# Patient Record
Sex: Male | Born: 1955 | Race: White | Hispanic: No | Marital: Single | Smoking: Former smoker
Health system: Southern US, Community
[De-identification: ages and names within clinical notes are randomized; demographics above are authoritative.]

## PROBLEM LIST (undated history)

## (undated) DIAGNOSIS — K219 Gastro-esophageal reflux disease without esophagitis: Secondary | ICD-10-CM

## (undated) DIAGNOSIS — N529 Male erectile dysfunction, unspecified: Secondary | ICD-10-CM

## (undated) DIAGNOSIS — F1910 Other psychoactive substance abuse, uncomplicated: Secondary | ICD-10-CM

## (undated) DIAGNOSIS — I10 Essential (primary) hypertension: Secondary | ICD-10-CM

## (undated) DIAGNOSIS — C4491 Basal cell carcinoma of skin, unspecified: Secondary | ICD-10-CM

## (undated) DIAGNOSIS — E291 Testicular hypofunction: Secondary | ICD-10-CM

## (undated) HISTORY — PX: SHOULDER INJECTION: SHX5048

## (undated) HISTORY — PX: LACERATION REPAIR: SHX5168

## (undated) HISTORY — DX: Essential (primary) hypertension: I10

## (undated) HISTORY — DX: Basal cell carcinoma of skin, unspecified: C44.91

## (undated) HISTORY — PX: NECK SURGERY: SHX720

## (undated) HISTORY — DX: Testicular hypofunction: E29.1

## (undated) HISTORY — DX: Gastro-esophageal reflux disease without esophagitis: K21.9

## (undated) HISTORY — DX: Other psychoactive substance abuse, uncomplicated: F19.10

## (undated) HISTORY — DX: Male erectile dysfunction, unspecified: N52.9

---

## 2002-12-25 ENCOUNTER — Encounter: Payer: Self-pay | Admitting: Family Medicine

## 2003-12-25 ENCOUNTER — Encounter: Payer: Self-pay | Admitting: Family Medicine

## 2003-12-31 ENCOUNTER — Ambulatory Visit: Payer: Self-pay | Admitting: Family Medicine

## 2004-01-05 ENCOUNTER — Ambulatory Visit: Payer: Self-pay | Admitting: Family Medicine

## 2004-02-25 HISTORY — PX: SHOULDER SURGERY: SHX246

## 2004-02-25 HISTORY — PX: SHOULDER ARTHROSCOPY: SHX128

## 2004-05-28 ENCOUNTER — Ambulatory Visit: Payer: Self-pay | Admitting: Family Medicine

## 2004-09-07 ENCOUNTER — Ambulatory Visit: Payer: Self-pay | Admitting: Specialist

## 2004-10-04 ENCOUNTER — Ambulatory Visit: Payer: Self-pay | Admitting: Family Medicine

## 2005-03-03 ENCOUNTER — Ambulatory Visit: Payer: Self-pay | Admitting: Specialist

## 2005-04-22 ENCOUNTER — Ambulatory Visit: Payer: Self-pay | Admitting: Family Medicine

## 2005-05-04 ENCOUNTER — Ambulatory Visit: Payer: Self-pay | Admitting: Family Medicine

## 2005-08-10 ENCOUNTER — Ambulatory Visit: Payer: Self-pay | Admitting: Family Medicine

## 2005-11-07 ENCOUNTER — Ambulatory Visit: Payer: Self-pay | Admitting: Family Medicine

## 2005-11-07 ENCOUNTER — Encounter: Admission: RE | Admit: 2005-11-07 | Discharge: 2005-11-07 | Payer: Self-pay | Admitting: Family Medicine

## 2006-01-13 ENCOUNTER — Inpatient Hospital Stay (HOSPITAL_COMMUNITY): Admission: RE | Admit: 2006-01-13 | Discharge: 2006-01-16 | Payer: Self-pay | Admitting: Orthopaedic Surgery

## 2006-01-13 HISTORY — PX: BACK SURGERY: SHX140

## 2006-07-24 ENCOUNTER — Ambulatory Visit: Payer: Self-pay | Admitting: Family Medicine

## 2006-07-24 DIAGNOSIS — F17200 Nicotine dependence, unspecified, uncomplicated: Secondary | ICD-10-CM

## 2006-08-02 ENCOUNTER — Ambulatory Visit: Payer: Self-pay | Admitting: Family Medicine

## 2006-08-02 LAB — CONVERTED CEMR LAB
ALT: 28 units/L (ref 0–53)
Alkaline Phosphatase: 58 units/L (ref 39–117)
BUN: 18 mg/dL (ref 6–23)
CO2: 31 meq/L (ref 19–32)
Calcium: 9.3 mg/dL (ref 8.4–10.5)
Cholesterol: 152 mg/dL (ref 0–200)
Creatinine, Ser: 1 mg/dL (ref 0.4–1.5)
Total Bilirubin: 1.1 mg/dL (ref 0.3–1.2)
Total Protein: 7 g/dL (ref 6.0–8.3)
Triglycerides: 147 mg/dL (ref 0–149)

## 2006-08-04 ENCOUNTER — Encounter: Payer: Self-pay | Admitting: Family Medicine

## 2006-08-05 DIAGNOSIS — F528 Other sexual dysfunction not due to a substance or known physiological condition: Secondary | ICD-10-CM

## 2006-08-05 DIAGNOSIS — E291 Testicular hypofunction: Secondary | ICD-10-CM

## 2006-08-08 ENCOUNTER — Ambulatory Visit: Payer: Self-pay | Admitting: Family Medicine

## 2006-08-08 DIAGNOSIS — I10 Essential (primary) hypertension: Secondary | ICD-10-CM | POA: Insufficient documentation

## 2006-08-24 ENCOUNTER — Ambulatory Visit: Payer: Self-pay | Admitting: Family Medicine

## 2006-08-24 ENCOUNTER — Encounter (INDEPENDENT_AMBULATORY_CARE_PROVIDER_SITE_OTHER): Payer: Self-pay | Admitting: *Deleted

## 2006-09-14 ENCOUNTER — Ambulatory Visit: Payer: Self-pay | Admitting: Family Medicine

## 2006-11-07 ENCOUNTER — Ambulatory Visit: Payer: Self-pay | Admitting: Family Medicine

## 2006-11-07 LAB — CONVERTED CEMR LAB
BUN: 15 mg/dL (ref 6–23)
CO2: 32 meq/L (ref 19–32)
Calcium: 9.5 mg/dL (ref 8.4–10.5)
Chloride: 102 meq/L (ref 96–112)
Creatinine, Ser: 1.1 mg/dL (ref 0.4–1.5)

## 2006-11-09 ENCOUNTER — Ambulatory Visit: Payer: Self-pay | Admitting: Family Medicine

## 2006-11-09 DIAGNOSIS — R7309 Other abnormal glucose: Secondary | ICD-10-CM | POA: Insufficient documentation

## 2007-05-01 ENCOUNTER — Encounter: Payer: Self-pay | Admitting: Family Medicine

## 2007-08-10 ENCOUNTER — Ambulatory Visit: Payer: Self-pay | Admitting: Family Medicine

## 2007-08-13 ENCOUNTER — Ambulatory Visit: Payer: Self-pay | Admitting: Family Medicine

## 2007-08-13 LAB — CONVERTED CEMR LAB
ALT: 29 units/L (ref 0–53)
AST: 27 units/L (ref 0–37)
Albumin: 4.2 g/dL (ref 3.5–5.2)
Alkaline Phosphatase: 51 units/L (ref 39–117)
Bilirubin, Direct: 0.1 mg/dL (ref 0.0–0.3)
GFR calc Af Amer: 114 mL/min
GFR calc non Af Amer: 95 mL/min
Glucose, Bld: 98 mg/dL (ref 70–99)
HCT: 45.1 % (ref 39.0–52.0)
HDL: 34.6 mg/dL — ABNORMAL LOW (ref >39.0)
LDL Cholesterol: 67 mg/dL (ref 0–99)
Lymphocytes Relative: 12.9 % (ref 12.0–46.0)
Monocytes Absolute: 0.5 10*3/uL (ref 0.1–1.0)
Monocytes Relative: 6.7 % (ref 3.0–12.0)
PSA: 0.34 ng/mL (ref 0.10–4.00)
Platelets: 175 10*3/uL (ref 150–400)
Potassium: 4.7 meq/L (ref 3.5–5.1)
RDW: 12.3 % (ref 11.5–14.6)
Sodium: 139 meq/L (ref 135–145)
Testosterone: 258.22 ng/dL — ABNORMAL LOW (ref 350.00–890)
Total Bilirubin: 1.2 mg/dL (ref 0.3–1.2)
Total CHOL/HDL Ratio: 3.6
Triglycerides: 107 mg/dL (ref 0–149)

## 2007-08-16 ENCOUNTER — Ambulatory Visit: Payer: Self-pay | Admitting: Family Medicine

## 2007-08-24 ENCOUNTER — Ambulatory Visit: Payer: Self-pay | Admitting: Family Medicine

## 2007-08-27 ENCOUNTER — Encounter (INDEPENDENT_AMBULATORY_CARE_PROVIDER_SITE_OTHER): Payer: Self-pay | Admitting: *Deleted

## 2008-05-19 ENCOUNTER — Telehealth: Payer: Self-pay | Admitting: Family Medicine

## 2008-10-07 ENCOUNTER — Ambulatory Visit: Payer: Self-pay | Admitting: Family Medicine

## 2008-10-07 ENCOUNTER — Encounter: Payer: Self-pay | Admitting: Family Medicine

## 2008-10-07 LAB — CONVERTED CEMR LAB
Bilirubin Urine: NEGATIVE
Blood in Urine, dipstick: NEGATIVE
Ketones, urine, test strip: NEGATIVE
Nitrite: NEGATIVE
Protein, U semiquant: NEGATIVE
Urobilinogen, UA: 0.2

## 2009-01-12 ENCOUNTER — Telehealth: Payer: Self-pay | Admitting: Family Medicine

## 2009-05-01 ENCOUNTER — Ambulatory Visit: Payer: Self-pay | Admitting: Internal Medicine

## 2009-09-01 ENCOUNTER — Encounter (INDEPENDENT_AMBULATORY_CARE_PROVIDER_SITE_OTHER): Payer: Self-pay | Admitting: *Deleted

## 2010-02-22 ENCOUNTER — Ambulatory Visit: Admit: 2010-02-22 | Payer: Self-pay | Admitting: Family Medicine
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
## 2010-02-23 NOTE — Letter (Signed)
Summary: Nadara Eaton letter  [place] [facility]  [address] [place], [place] [address]   Phone: [phone]  Fax: [phone]       09/01/2009 MRN: [id number]  [facility] [address] [place], [place]  [address]  Dear Mr. [facility] [place] and [facility] announce the retirement of Arta Silence, M.D., from full-time practice at the [facility] office effective July 23, 2009 and his plans of returning part-time.  It is important to Dr. Hetty Ely and to our practice that you understand that [facility] has seven physicians in our office for your health care needs.  We will continue to offer the same exceptional care that you have today.    Dr. Hetty Ely has spoken to many of you about his plans for retirement and returning part-time in the fall.   We will continue to work with you through the transition to schedule appointments for you in the office and meet the high standards that [facility] is committed to.   Again, it is with great pleasure that we share the news that Dr. Hetty Ely will return to [facility] in October of 2011 with a reduced schedule.    If you have any questions, or would like to request an appointment with one of our physicians, please call us at [phone] and press the option for Scheduling an appointment.  We take pleasure in providing you with excellent patient care and look forward to seeing you at your next office visit.  Our [facility] Physicians are:  Tillman Abide, M.D. Laurita Quint, M.D. Roxy Manns, M.D. Kerby Nora, M.D. Hannah Beat, M.D. Ruthe Mannan, M.D. We proudly welcomed Raechel Ache, M.D. and Eustaquio Boyden, M.D. to the practice in July/August 2011.  Sincerely,  [facility]

## 2010-06-11 NOTE — Op Note (Signed)
Ralph Munoz, Ralph Munoz               ACCOUNT NO.:  [id number]   MEDICAL RECORD NO.:  [id number]          PATIENT TYPE:  INP   LOCATION:  5021                         FACILITY:  [facility]   PHYSICIAN:  Mark C. Ophelia Charter, M.D.    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   PROCEDURE:  Left L3-4 transforaminal lumbar interbody fusion, 360-  fusion, EBI Biomet Polaris pedicle instrumentation, 40 mm rod, 6.5 x 45  mm pedicle screws, 9 mm PEEK spacer, autograft interbody bilateral  lateral gutter fusion with Vitoss and local bone with iliac crest  aspirate.   SURGEON:  Mark C. Ophelia Charter, M.D.   ANESTHESIA:  GOT.   ASSISTANT:  Wende Neighbors, P.A.   ESTIMATED BLOOD LOSS:  700 cc auto retransfusion, CellSaver 400 mL.   DRAINS:  One Hemovac subcu.   PROCEDURE:  After the induction of general anesthesia and oral  endotracheal intubation, the patient was placed in the prone position.  Two grams of Ancef were given due to his large size.  Standard prepping  and draping was performed calf pumpers.  A midline incision was made  over the planned L3-4 level and once the spinous processes were  identified, fluoroscopy was brought in, draped and confirmed the  appropriate level.  Fluoroscopy was then pulled out.  Decompression was  performed and, once the dura was visualized, the microscope was brought  in.  Lamina was taken with some large pieces as well as the  spondylolytic bone, which was meticulously cleaned off by the scrub  nurse.  Large pieces of bone were saved for the later interbody.  Small  pieces were cleaned of all soft tissue chipped into small Grapenuts size  pieces.   L3-4 disk space was identified.  Pedicle was a little bit lower  than normal and was tall in the cephalocaudal direction.   Diskectomy was performed with the nerve root protected with D'Errico and  the dura retracted less in the midline.  There were overhanging spurs  and box cutter had to be used with careful protection of the dura in  order to enter.  No dural tear occurred during the case.  Diskectomy was  performed using a combination of right and left curets, large Epstein  curets, up biters, down biters, spot chisel reamers, 9 mm box chisel,  angled curets, straight curets and ring curets.  The usual gambit of all  equipment was used in order to complete the diskectomy.  There was  actually not much disk present inside.  The interior anulus was intact.  Size was with a 7 and then following 9 was performed.  Fluoroscopy was  brought back in and checked.  It was a nice fit with the 9.  A 9 mm PEEK  was packed with chips of bone.  The large pieces of bone  were custom  fit, impacted and pushed anteriorly, checked under fluoroscopy and the  PEEK was placed behind it and then rotated until it was in good position  following the radiographic markers.  Nerve root was intact.  Dura was  intact.  Some Gelfoam was placed.  Some epidural veins with microscope  had been coagulated.  The scope was brought out and fluoroscopy was used  for placement of the pedicle screws.   Starting on the left, a portion of the upsweep of the facet was burred  back and the interval between the transverse process and the facet was  used first with the awl, checked under fluoroscopy and then the straight  joystick pedicle finder followed by the ball stick pedicle feeler,  checking under fluoroscopy, the tap checking under fl uroscopy and then  placement of the screw with final confirmation.  All four screws were  placed in a similar position.  On the right at the inferior screw which  was in L4, initially the tap  as it was twisted in migrated up toward the  endplate.  It was backed out, redirected and a bur was used since the  tap had begun to walk cephalad as it was started.  After burring, the  tap followed the appropriate course.  Checked under fluoroscopy.  Balltip probe showed it was completely into bone and final placement of  the screw checking under fluoroscopy spot film was taken.  Good position  of the interbody.  Forty millimeter rods were placed.  One end was  tightened down, compressed.  Care was taken to make sure that 1 or 2 mm  was sticking out through both ends of the screw and the compressor was  used on both sides.  Position looked good.  There was no angulation.  PEEK spacer was centered and C-arm was removed.  Transverse process had  been decorticated.  Vitoss was used with the iliac crest aspirate on the  right side.  A stab incision was made.  Iliac aspirate 8 mL onto the  Vitoss.  Vitoss splint in half, placed down on the transverse processes  and then local bone packed down over the top.  Dura was checked.  Interbody was checked.  There was no bone over the dura.  Bilateral  decompression had been performed.   After closure of the deep fascia with 0 Vicryl, a Hemovac was placed  above the fascia, 2-0 Vicryl in the subcutaneous tissue and then skin  closure postop dressing and transferred to the recovery room in stable  condition.  Instrument count and needle count was correct.      Mark C. Ophelia Charter, M.D.  Electronically Signed     MCY/MEDQ  D:  01/13/2006  T:  01/14/2006  Job:  [id number]

## 2010-06-11 NOTE — Discharge Summary (Signed)
NAMEBRITTAIN, Ralph Munoz               ACCOUNT NO.:  [id number]   MEDICAL RECORD NO.:  [id number]          PATIENT TYPE:  INP   LOCATION:  5021                         FACILITY:  [facility]   PHYSICIAN:  Mark C. Ophelia Charter, M.D.    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   HISTORY OF PRESENT ILLNESS:  This 55 year old male developed  spondylolysis, spondylolisthesis which persisted in increased back pain.  Workup included MRI scan and myelogram CT scan.  Medications include  aspirin, vitamin E and multivitamins.  He has had progressive increasing  back pain, left thigh pain, difficulty ambulating.  He had trace quad  weakness, dorsal foot numbness.  After informed consent, patient was  taken to the operating room and underwent an instrument and fusion  procedure with left L3-4 TLIF, 360 fusion, EBI Biomet Polaris pedicle  instrumentation, 40-mm rod, 6.5 x 45-mm pedicle screws, 9-mm PEEK  spacer, autograft interbody, plus bilateral gutter fusion with VITOSS of  local bone and iliac crest aspirate on January 16, 2006.  He had 700 mL  blood loss with 400 mL retransfusion, using the CellSaver, of his own  blood.   Postoperatively, he received mechanical DVT prophylaxis, plus TEDs.  Sodium was 125, hemoglobin was 13.4, hematocrit of 40, platelets 146.  PT/OT started.  He was placed on Reglan, Protonix.  Postop day 2, BMET  was normal.  He was changed to OxyContin 20 mg q.8, plus Tylox for  breakthrough pain.  The Foley was removed, he was voiding well on his  own and was discharged on January 13, 2006.  He was using a corset when  he was up out of bed.  Colace and was voiding well and good relief from  preop leg pain.  Postop x-rays showed good position and alignment.  Four  flora spot  films of his interbody fusion and hardware fixation.   CONDITION AT DISCHARGE:  Stable.      Mark C. Ophelia Charter, M.D.  Electronically Signed     MCY/MEDQ  D:  03/03/2006  T:  03/04/2006  Job:  [id number]

## 2010-08-06 ENCOUNTER — Encounter: Payer: Self-pay | Admitting: Family Medicine

## 2010-08-09 ENCOUNTER — Ambulatory Visit (INDEPENDENT_AMBULATORY_CARE_PROVIDER_SITE_OTHER): Payer: 59 | Admitting: Family Medicine

## 2010-08-09 ENCOUNTER — Encounter: Payer: Self-pay | Admitting: Family Medicine

## 2010-08-09 VITALS — BP 122/72 | HR 76 | Temp 98.4°F | Wt 214.2 lb

## 2010-08-09 DIAGNOSIS — L989 Disorder of the skin and subcutaneous tissue, unspecified: Secondary | ICD-10-CM

## 2010-08-09 MED ORDER — DOXAZOSIN MESYLATE 4 MG PO TABS: 4.0000 mg | ORAL_TABLET | Freq: Every day | ORAL | Status: DC

## 2010-08-09 NOTE — Assessment & Plan Note (Signed)
Suspect SCC, await path and then proceed from that point.  He understood.  Will address other lesions in the future.  Will return for CPE and antecedent labs.

## 2010-08-09 NOTE — Progress Notes (Signed)
"  Spots on skin". 4 lesions:  1cm lesion on L posterior/upper torso, had bleed prev.  Not painful.  Unknown duration.   0.5cm fleshy papule on L calf  0.5cm flat darker (blue tinted lesion) L forearm  2cm flat red plaque on L elbow  Meds, vitals, and allergies reviewed.   ROS: See HPI.  Otherwise, noncontributory.  Shave biopsy- we talked about options.  I am most concerned with the lesion on the back.  Will proceed with shave.   Indication: suspicious lesion  Location:L upper back  Size:1cm  Verbal informed consent obtained.  Pt aware of risks not limited to but including infection,  bleeding, damage to near by organs.  Prep: etoh/betadine  Anesthesia: 1%lidocaine with epi, good effect  Shave made with dermablade  Minimal oozing, controlled with silver nitrate  Tolerated well, no complications.   Routine postprocedure instructions d/w pt- keep area clean and bandaged, follow up if concerns/spreading erythema/pain.

## 2010-08-09 NOTE — Patient Instructions (Signed)
Keep the area covered with neosporin and a bandaid.  We'll contact you with your lab report. Schedule a physical with labs ahead of time.   Take care.  Glad to see you.

## 2010-08-10 ENCOUNTER — Encounter: Payer: Self-pay | Admitting: Family Medicine

## 2010-08-10 ENCOUNTER — Telehealth: Payer: Self-pay | Admitting: Family Medicine

## 2010-08-10 DIAGNOSIS — C4491 Basal cell carcinoma of skin, unspecified: Secondary | ICD-10-CM | POA: Insufficient documentation

## 2010-08-10 NOTE — Telephone Encounter (Signed)
Please call pt.  I tried to call but couldn't LMOVM.  He did have a skin cancer (basal cell).  This is very treatable.  It was only partially removed.  He'll need to have more removed after he heals up.  I would prefer that he saw dermatology about this.  I put in the referral.  Please notify Shirlee Limerick to proceed with referral after you contact pt.

## 2010-08-10 NOTE — Telephone Encounter (Signed)
Advised pt, he agrees to dermatologist, wants to see someone in [place].

## 2010-08-11 NOTE — Telephone Encounter (Signed)
See below

## 2010-08-13 NOTE — Telephone Encounter (Signed)
Noted  

## 2010-08-13 NOTE — Telephone Encounter (Signed)
Appt made with Tollie Eth letter mailed to the patient with appt information on it.

## 2010-08-18 ENCOUNTER — Other Ambulatory Visit: Payer: Self-pay | Admitting: Family Medicine

## 2010-08-18 DIAGNOSIS — I10 Essential (primary) hypertension: Secondary | ICD-10-CM

## 2010-08-18 DIAGNOSIS — N400 Enlarged prostate without lower urinary tract symptoms: Secondary | ICD-10-CM

## 2010-08-23 ENCOUNTER — Other Ambulatory Visit (INDEPENDENT_AMBULATORY_CARE_PROVIDER_SITE_OTHER): Payer: Commercial Managed Care - PPO | Admitting: Family Medicine

## 2010-08-23 ENCOUNTER — Other Ambulatory Visit: Payer: Self-pay | Admitting: Family Medicine

## 2010-08-23 ENCOUNTER — Telehealth: Payer: Self-pay | Admitting: *Deleted

## 2010-08-23 DIAGNOSIS — N400 Enlarged prostate without lower urinary tract symptoms: Secondary | ICD-10-CM

## 2010-08-23 DIAGNOSIS — E875 Hyperkalemia: Secondary | ICD-10-CM

## 2010-08-23 DIAGNOSIS — I10 Essential (primary) hypertension: Secondary | ICD-10-CM

## 2010-08-23 LAB — LIPID PANEL
Cholesterol: 123 mg/dL (ref 0–200)
HDL: 31.3 mg/dL — ABNORMAL LOW (ref >39.00)
LDL Cholesterol: 77 mg/dL (ref 0–99)
Triglycerides: 72 mg/dL (ref 0.0–149.0)
VLDL: 14.4 mg/dL (ref 0.0–40.0)

## 2010-08-23 LAB — COMPREHENSIVE METABOLIC PANEL
Albumin: 4.3 g/dL (ref 3.5–5.2)
Alkaline Phosphatase: 47 U/L (ref 39–117)
BUN: 21 mg/dL (ref 6–23)
Creatinine, Ser: 1.1 mg/dL (ref 0.4–1.5)
Glucose, Bld: 99 mg/dL (ref 70–99)
Potassium: 5.8 mEq/L — ABNORMAL HIGH (ref 3.5–5.1)
Total Bilirubin: 0.7 mg/dL (ref 0.3–1.2)

## 2010-08-23 NOTE — Telephone Encounter (Signed)
Tried calling cell number, but patient's mailbox is full, will try other numbers. Tried calling emergency contact, number disconnected. Called work number which was a cell number and left a message.

## 2010-08-23 NOTE — Telephone Encounter (Signed)
Message copied by Sueanne Margarita on Mon Aug 23, 2010  4:56 PM ------      Message from: Lars Mage      Created: Mon Aug 23, 2010  1:49 PM       Please call pt.  All of his labs are fine except for his potassium, which is high.  I'll talk to him about the other labs at the OV.  In the meantime, we need to treat the high potassium.  Please call in kayexalate 15g po x1 today.  Recheck K tomorrow or Wednesday at lab visit- please schedule this.  Thanks.  I put in the order for the K lab.

## 2010-08-24 ENCOUNTER — Encounter: Payer: Self-pay | Admitting: *Deleted

## 2010-08-24 NOTE — Telephone Encounter (Signed)
Patient still not calling back, someone from his work number called back and stated he no longer works there, pt has physical scheduled for Monday 08/30/10 @ 8:45.

## 2010-08-24 NOTE — Telephone Encounter (Signed)
Please send a certified letter and keep trying the cell number.  Thanks.

## 2010-08-24 NOTE — Telephone Encounter (Signed)
Noted.  This seems be the quickest way to get him rechecked.

## 2010-08-24 NOTE — Telephone Encounter (Signed)
Patient notified. 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 He said it will be Saturday (08-28-10) before he can pick up his Rx. I told him to take it on Saturday and then we will recheck his levels when he come in for his appt on the following Monday on (08-30-10). He verbalized understanding.

## 2010-08-30 ENCOUNTER — Ambulatory Visit (INDEPENDENT_AMBULATORY_CARE_PROVIDER_SITE_OTHER): Payer: Commercial Managed Care - PPO | Admitting: Family Medicine

## 2010-08-30 ENCOUNTER — Encounter: Payer: Self-pay | Admitting: Family Medicine

## 2010-08-30 ENCOUNTER — Other Ambulatory Visit: Payer: Self-pay | Admitting: *Deleted

## 2010-08-30 DIAGNOSIS — Z0000 Encounter for general adult medical examination without abnormal findings: Secondary | ICD-10-CM

## 2010-08-30 DIAGNOSIS — F528 Other sexual dysfunction not due to a substance or known physiological condition: Secondary | ICD-10-CM

## 2010-08-30 DIAGNOSIS — I10 Essential (primary) hypertension: Secondary | ICD-10-CM

## 2010-08-30 DIAGNOSIS — E875 Hyperkalemia: Secondary | ICD-10-CM

## 2010-08-30 DIAGNOSIS — C4491 Basal cell carcinoma of skin, unspecified: Secondary | ICD-10-CM

## 2010-08-30 DIAGNOSIS — F17200 Nicotine dependence, unspecified, uncomplicated: Secondary | ICD-10-CM

## 2010-08-30 DIAGNOSIS — Z1211 Encounter for screening for malignant neoplasm of colon: Secondary | ICD-10-CM

## 2010-08-30 DIAGNOSIS — E291 Testicular hypofunction: Secondary | ICD-10-CM

## 2010-08-30 DIAGNOSIS — Z8042 Family history of malignant neoplasm of prostate: Secondary | ICD-10-CM

## 2010-08-30 LAB — TESTOSTERONE: Testosterone: 1338.25 ng/dL — ABNORMAL HIGH (ref 350.00–890.00)

## 2010-08-30 LAB — POTASSIUM: Potassium: 4.2 mEq/L (ref 3.5–5.1)

## 2010-08-30 MED ORDER — SODIUM POLYSTYRENE SULFONATE 15 GM/60ML PO SUSP: 15.0000 g | Freq: Once | ORAL | Status: AC

## 2010-08-30 MED ORDER — SODIUM POLYSTYRENE SULFONATE 15 GM/60ML PO SUSP: 15.0000 g | Freq: Once | ORAL | Status: DC

## 2010-08-30 NOTE — Progress Notes (Signed)
CPE- See plan.  Routine anticipatory guidance given to patient.  See health maintenance.  Smoking.  He has noted some wheeze, occ.  We talked about options.  He would like to use the electronic cigarette. He'll look into this.    BCC on back- f/u with Terri Piedra derm tomorrow.  Other skin lesions to be checked at the same time.   Hypertension:    Using medication without problems or lightheadedness: yes Chest pain with exertion:no Edema:no Short of breath:no  Hyperkalemia, due for recheck.  He hasn't had the kayexalate yet.  No sx.    PMH and SH reviewed  Meds, vitals, and allergies reviewed.   ROS: See HPI.  Otherwise negative.    GEN: nad, alert and oriented HEENT: mucous membranes moist NECK: supple w/o LA CV: rrr. PULM: ctab, no inc wob ABD: soft, +bs EXT: trace edema SKIN: shave site healed on L upper back with some minimal crusting.   Prostate gland firm and smooth, no enlargement, nodularity, tenderness, mass, asymmetry or induration.

## 2010-08-30 NOTE — Patient Instructions (Signed)
Look into the electronic cigarette.  It can help get you off cigarettes. We'll contact you with your lab report.  Don't take the sodium polystyrene in the meantime. Avoid high potassium foods (like citrus) for now. Send the stool cards back in.   We'll let you know about your testosterone level.  Try to walk more for exercise.   Glad to see you.

## 2010-08-31 ENCOUNTER — Encounter: Payer: Self-pay | Admitting: Family Medicine

## 2010-08-31 DIAGNOSIS — Z1211 Encounter for screening for malignant neoplasm of colon: Secondary | ICD-10-CM | POA: Insufficient documentation

## 2010-08-31 DIAGNOSIS — Z8042 Family history of malignant neoplasm of prostate: Secondary | ICD-10-CM | POA: Insufficient documentation

## 2010-08-31 DIAGNOSIS — Z0000 Encounter for general adult medical examination without abnormal findings: Secondary | ICD-10-CM | POA: Insufficient documentation

## 2010-08-31 NOTE — Assessment & Plan Note (Signed)
Has f/u with derm tomorrow.

## 2010-08-31 NOTE — Assessment & Plan Note (Signed)
See notes on labs. 

## 2010-08-31 NOTE — Assessment & Plan Note (Signed)
Refer to uro if pt desires.

## 2010-08-31 NOTE — Assessment & Plan Note (Signed)
bp controlled, labs d/w pt.  Work on diet/exericse/weight loss.

## 2010-08-31 NOTE — Assessment & Plan Note (Signed)
psa and exam wnl today.

## 2010-08-31 NOTE — Assessment & Plan Note (Signed)
D/w patient re:options for colon cancer screening, including IFOB vs. colonoscopy.  Risks and benefits of both were discussed and patient voiced understanding.  Pt elects for:IFOB  

## 2010-08-31 NOTE — Assessment & Plan Note (Signed)
He'll work on cessation.  Lungs ctab today.

## 2010-08-31 NOTE — Assessment & Plan Note (Signed)
D/w pt about diet/exercise.  Healthy habits encouraged.  D/w pt about flu shot.  Td up to date.

## 2010-09-17 ENCOUNTER — Other Ambulatory Visit: Payer: Self-pay | Admitting: Surgery

## 2010-09-23 ENCOUNTER — Other Ambulatory Visit: Payer: Self-pay | Admitting: Family Medicine

## 2010-09-23 ENCOUNTER — Other Ambulatory Visit: Payer: Commercial Managed Care - PPO

## 2010-09-23 DIAGNOSIS — Z1289 Encounter for screening for malignant neoplasm of other sites: Secondary | ICD-10-CM

## 2010-09-23 LAB — FECAL OCCULT BLOOD, IMMUNOCHEMICAL: Fecal Occult Bld: NEGATIVE

## 2010-09-24 ENCOUNTER — Encounter: Payer: Self-pay | Admitting: *Deleted

## 2010-10-24 ENCOUNTER — Encounter: Payer: Self-pay | Admitting: Family Medicine

## 2010-10-26 ENCOUNTER — Other Ambulatory Visit: Payer: Self-pay | Admitting: Internal Medicine

## 2010-10-26 ENCOUNTER — Other Ambulatory Visit: Payer: Self-pay | Admitting: *Deleted

## 2010-10-26 MED ORDER — DOXAZOSIN MESYLATE 4 MG PO TABS: 4.0000 mg | ORAL_TABLET | Freq: Every day | ORAL | Status: DC

## 2010-10-29 NOTE — Telephone Encounter (Signed)
Pt requests quantity on cardura script be changed from 30 to 90.  I called [facility] [place] and changed script from 30 with 6 refills to 90 with one refill.  They are filling script with 8 mg's, one half a day, because 4 mg's is on back order.  Also, mailed copy of last office visit and lab results to pt's home address, per his request.

## 2011-02-16 ENCOUNTER — Other Ambulatory Visit: Payer: Self-pay | Admitting: Family Medicine

## 2011-02-16 NOTE — Telephone Encounter (Signed)
Patient requesting blood pressure medication to be called into [facility] at [facility].  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  He said that he can ask [facility] on [address] to transfer to the [facility] nearest his location once he knows it has been called in.  Patient also said that he would like to schedule a visit and that he is going to check with his company on his schedule in order for him to call back and make an appointment with Dr. Para March for physical and to discuss smoking cessation.  Patient can be called at [phone].  Please call back when medication has been called in.  Thanks

## 2011-02-16 NOTE — Telephone Encounter (Signed)
See phone note. Is it okay to refill medication? 

## 2011-02-17 MED ORDER — DOXAZOSIN MESYLATE 4 MG PO TABS: 4.0000 mg | ORAL_TABLET | Freq: Every day | ORAL | Status: DC

## 2011-02-17 NOTE — Telephone Encounter (Signed)
rx sent, please notify pt.  Thanks.

## 2011-02-17 NOTE — Telephone Encounter (Signed)
Patient advised.

## 2016-09-23 ENCOUNTER — Ambulatory Visit (INDEPENDENT_AMBULATORY_CARE_PROVIDER_SITE_OTHER): Payer: Commercial Managed Care - PPO | Admitting: Primary Care

## 2016-09-23 ENCOUNTER — Encounter: Payer: Self-pay | Admitting: Primary Care

## 2016-09-23 ENCOUNTER — Ambulatory Visit (INDEPENDENT_AMBULATORY_CARE_PROVIDER_SITE_OTHER)
Admission: RE | Admit: 2016-09-23 | Discharge: 2016-09-23 | Disposition: A | Discharge status: Home / Self Care | Payer: Commercial Managed Care - PPO | Source: Ambulatory Visit | Attending: Primary Care | Admitting: Primary Care

## 2016-09-23 VITALS — BP 136/86 | HR 72 | Temp 98.0°F | Ht 69.0 in | Wt 237.8 lb

## 2016-09-23 DIAGNOSIS — I10 Essential (primary) hypertension: Secondary | ICD-10-CM | POA: Diagnosis not present

## 2016-09-23 DIAGNOSIS — M545 Low back pain: Secondary | ICD-10-CM

## 2016-09-23 DIAGNOSIS — G8929 Other chronic pain: Secondary | ICD-10-CM

## 2016-09-23 DIAGNOSIS — F17200 Nicotine dependence, unspecified, uncomplicated: Secondary | ICD-10-CM | POA: Diagnosis not present

## 2016-09-23 MED ORDER — CYCLOBENZAPRINE HCL 10 MG PO TABS: ORAL_TABLET | ORAL | 0 refills | Status: DC

## 2016-09-23 NOTE — Assessment & Plan Note (Signed)
Chronic, no problems for years, return in symptoms over the past 1 year. No alarm signs. Exam today suggestive of arthritis. Will check plain films today to ensure hardware stability and no new change. He declines physical therapy. Rx for Flexeril sent to pharmacy for muscle tightness. Consider Diclofenac if no improvement. May need pain management referral.

## 2016-09-23 NOTE — Assessment & Plan Note (Signed)
Quit smoking 5 months ago. Commended him on this.

## 2016-09-23 NOTE — Assessment & Plan Note (Signed)
Stable in the office today. Discussed the importance of a healthy diet and regular exercise in order for weight loss, and to reduce the risk of other medical problems. Continue to monitor.
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
## 2016-09-23 NOTE — Patient Instructions (Signed)
Complete xray(s) prior to leaving today. I will notify you of your results once received.  You may take the cyclobenzaprine muscle relaxer at bedtime as needed for muscle tightness.  You may also take Aleve or tylenol as needed.  Please schedule a physical with me in the near future at your convenience. You may also schedule a lab only appointment 3-4 days prior. We will discuss your lab results in detail during your physical.  It was a pleasure to meet you today! Please don't hesitate to call me with any questions. Welcome to [facility]!

## 2016-09-27 ENCOUNTER — Other Ambulatory Visit: Payer: Self-pay | Admitting: Primary Care

## 2016-09-27 DIAGNOSIS — M545 Low back pain: Principal | ICD-10-CM

## 2016-09-27 DIAGNOSIS — G8929 Other chronic pain: Secondary | ICD-10-CM

## 2016-09-27 MED ORDER — DICLOFENAC SODIUM 75 MG PO TBEC: 75.0000 mg | DELAYED_RELEASE_TABLET | Freq: Two times a day (BID) | ORAL | 0 refills | Status: DC | PRN

## 2016-10-20 ENCOUNTER — Ambulatory Visit (INDEPENDENT_AMBULATORY_CARE_PROVIDER_SITE_OTHER): Payer: Commercial Managed Care - PPO | Admitting: Primary Care

## 2016-10-20 ENCOUNTER — Encounter: Payer: Self-pay | Admitting: Primary Care

## 2016-10-20 VITALS — BP 164/98 | HR 81 | Temp 98.3°F | Ht 69.0 in | Wt 239.8 lb

## 2016-10-20 DIAGNOSIS — Z1211 Encounter for screening for malignant neoplasm of colon: Secondary | ICD-10-CM

## 2016-10-20 DIAGNOSIS — G8929 Other chronic pain: Secondary | ICD-10-CM | POA: Diagnosis not present

## 2016-10-20 DIAGNOSIS — M545 Low back pain: Secondary | ICD-10-CM | POA: Diagnosis not present

## 2016-10-20 DIAGNOSIS — I10 Essential (primary) hypertension: Secondary | ICD-10-CM | POA: Diagnosis not present

## 2016-10-20 DIAGNOSIS — Z23 Encounter for immunization: Secondary | ICD-10-CM | POA: Diagnosis not present

## 2016-10-20 DIAGNOSIS — Z0000 Encounter for general adult medical examination without abnormal findings: Secondary | ICD-10-CM | POA: Diagnosis not present

## 2016-10-20 DIAGNOSIS — Z122 Encounter for screening for malignant neoplasm of respiratory organs: Secondary | ICD-10-CM | POA: Diagnosis not present

## 2016-10-20 DIAGNOSIS — Z125 Encounter for screening for malignant neoplasm of prostate: Secondary | ICD-10-CM

## 2016-10-20 LAB — COMPREHENSIVE METABOLIC PANEL
ALBUMIN: 4.6 g/dL (ref 3.5–5.2)
ALT: 29 U/L (ref 0–53)
AST: 19 U/L (ref 0–37)
Alkaline Phosphatase: 61 U/L (ref 39–117)
BILIRUBIN TOTAL: 0.7 mg/dL (ref 0.2–1.2)
BUN: 16 mg/dL (ref 6–23)
CALCIUM: 9.5 mg/dL (ref 8.4–10.5)
CHLORIDE: 101 meq/L (ref 96–112)
CO2: 32 mEq/L (ref 19–32)
CREATININE: 0.97 mg/dL (ref 0.40–1.50)
GFR: 83.64 mL/min (ref >60.00)
Glucose, Bld: 94 mg/dL (ref 70–99)
Potassium: 4.8 mEq/L (ref 3.5–5.1)
SODIUM: 139 meq/L (ref 135–145)
TOTAL PROTEIN: 7.3 g/dL (ref 6.0–8.3)

## 2016-10-20 LAB — LIPID PANEL
CHOLESTEROL: 160 mg/dL (ref 0–200)
HDL: 43.8 mg/dL (ref >39.00)
LDL CALC: 95 mg/dL (ref 0–99)
NonHDL: 115.73
TRIGLYCERIDES: 102 mg/dL (ref 0.0–149.0)
Total CHOL/HDL Ratio: 4
VLDL: 20.4 mg/dL (ref 0.0–40.0)

## 2016-10-20 LAB — PSA: PSA: 1.61 ng/mL (ref 0.10–4.00)

## 2016-10-20 LAB — HEMOGLOBIN A1C: HEMOGLOBIN A1C: 5.6 % (ref 4.6–6.5)

## 2016-10-20 NOTE — Patient Instructions (Signed)
Complete lab work prior to leaving today.   You were provided with a tetanus vaccination which will cover you for 10 years. You were provided with a flu shot today.  Start exercising. You should be getting 150 minutes of moderate intensity exercise weekly.  It's important to improve your diet by reducing consumption of fast food, fried food, processed snack foods. Increase consumption of fresh vegetables and fruits, whole grains, water.  Ensure you are drinking 64 ounces of water daily.  Start monitoring your blood pressure daily, around the same time everyday, for the next several weeks. Please call me if you get readings consistently above 140/90.  I'll be in touch once we receive your results. Have a good day!

## 2016-10-20 NOTE — Assessment & Plan Note (Signed)
Above goal in the office today, even on recheck.  Will have him start closely monitoring and report readings at or above 140/90.  Strongly encouraged him to start exercising and improve diet for weight loss.

## 2016-10-20 NOTE — Assessment & Plan Note (Signed)
Overall better, still experiences chronic pain. Taking Flexeril and Diclofenac sparingly.

## 2016-10-20 NOTE — Addendum Note (Signed)
Addended by: Pleas Koch on: 10/20/2016 11:43 AM   Modules accepted: Orders

## 2016-10-20 NOTE — Assessment & Plan Note (Signed)
Td due, provided today. Influenza vaccination provided today. PSA pending. Colonoscopy due, referral placed. Discussed the importance of a healthy diet and regular exercise in order for weight loss, and to reduce the risk of other medical problems. Exam overall stable. Labs pending. Follow up in 1 year.
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
## 2016-10-20 NOTE — Addendum Note (Signed)
Addended by: Modena Nunnery on: 10/20/2016 11:48 AM   Modules accepted: Orders

## 2016-10-25 ENCOUNTER — Encounter: Payer: Self-pay | Admitting: Gastroenterology

## 2016-10-31 ENCOUNTER — Encounter: Payer: Self-pay | Admitting: *Deleted

## 2016-11-07 ENCOUNTER — Other Ambulatory Visit: Payer: Self-pay | Admitting: Acute Care

## 2016-11-07 DIAGNOSIS — Z122 Encounter for screening for malignant neoplasm of respiratory organs: Secondary | ICD-10-CM

## 2016-11-07 DIAGNOSIS — Z87891 Personal history of nicotine dependence: Secondary | ICD-10-CM

## 2016-11-18 ENCOUNTER — Ambulatory Visit (INDEPENDENT_AMBULATORY_CARE_PROVIDER_SITE_OTHER)
Admission: RE | Admit: 2016-11-18 | Discharge: 2016-11-18 | Disposition: A | Discharge status: Home / Self Care | Payer: Commercial Managed Care - PPO | Source: Ambulatory Visit | Attending: Acute Care | Admitting: Acute Care

## 2016-11-18 ENCOUNTER — Ambulatory Visit (INDEPENDENT_AMBULATORY_CARE_PROVIDER_SITE_OTHER): Payer: Commercial Managed Care - PPO | Admitting: Acute Care

## 2016-11-18 ENCOUNTER — Encounter: Payer: Self-pay | Admitting: Acute Care

## 2016-11-18 DIAGNOSIS — Z122 Encounter for screening for malignant neoplasm of respiratory organs: Secondary | ICD-10-CM

## 2016-11-18 DIAGNOSIS — Z87891 Personal history of nicotine dependence: Secondary | ICD-10-CM

## 2016-11-18 NOTE — Progress Notes (Signed)
Shared Decision Making Visit Lung Cancer Screening Program [phone])   Eligibility:  Age 61 y.o.  Pack Years Smoking History Calculation 50-pack-year smoking history (# packs/per year x # years smoked)  Recent History of coughing up blood  no  Unexplained weight loss? no ( >Than 15 pounds within the last 6 months )  Prior History Lung / other cancer no (Diagnosis within the last 5 years already requiring surveillance chest CT Scans).  Smoking Status Former Smoker  Former Smokers: Years since quit: < 1 year  Quit Date: March 2018  Visit Components:  Discussion included one or more decision making aids. yes  Discussion included risk/benefits of screening. yes  Discussion included potential follow up diagnostic testing for abnormal scans. yes  Discussion included meaning and risk of over diagnosis. yes  Discussion included meaning and risk of False Positives. yes  Discussion included meaning of total radiation exposure. yes  Counseling Included:  Importance of adherence to annual lung cancer LDCT screening. yes  Impact of comorbidities on ability to participate in the program. yes  Ability and willingness to under diagnostic treatment. yes  Smoking Cessation Counseling:  Current Smokers:   Discussed importance of smoking cessation. No patient is a former smoker  Information about tobacco cessation classes and interventions provided to patient.  No, patient is a former smoker  Patient provided with "ticket" for LDCT Scan. yes  Symptomatic Patient. no  Counseling not applicable patient is not symptomatic  Diagnosis Code: Tobacco Use Z72.0  Asymptomatic Patient yes  Counseling (Intermediate counseling: > three minutes counseling) Z6010  Former Smokers:   Discussed the importance of maintaining cigarette abstinence. yes  Diagnosis Code: Personal History of Nicotine Dependence. X32.355  Information about tobacco cessation classes and interventions provided  to patient. Yes  Patient provided with "ticket" for LDCT Scan. yes  Written Order for Lung Cancer Screening with LDCT placed in Epic. Yes (CT Chest Lung Cancer Screening Low Dose W/O CM) [id number] Z12.2-Screening of respiratory organs Z87.891-Personal history of nicotine dependence  I spent 25 minutes of face to face time with Ralph Munoz discussing the risks and benefits of lung cancer screening. We viewed a power point together that explained in detail the above noted topics. We took the time to pause the power point at intervals to allow for questions to be asked and answered to ensure understanding. We discussed that he had taken the single most powerful action possible to decrease his risk of developing lung cancer when he quit smoking. I counseled him to remain smoke free, and to contact me if he ever had the desire to smoke again so that I can provide resources and tools to help support the effort to remain smoke free. We discussed the time and location of the scan, and that either  [name] RN or I will call with the results within  24-48 hours of receiving them.  Ralph Munoz has my card and contact information in the event he needs to speak with me, in addition to a copy of the power point we reviewed as a resource.  Ralph Munoz verbalized understanding of all of the above and had no further questions upon leaving the office.     I explained to the patient that there has been a high incidence of coronary artery disease noted on these exams. I explained that this is a non-gated exam therefore degree or severity cannot be determined. This patient is not on statin therapy her epic records. I have asked the patient  to follow-up with their PCP regarding any incidental finding of coronary artery disease and management with diet or medication as they feel is clinically indicated. The patient verbalized understanding of the above and had no further questions.     Magdalen Spatz,  NP 11/18/2016

## 2016-11-21 ENCOUNTER — Telehealth: Payer: Self-pay | Admitting: Acute Care

## 2016-11-21 NOTE — Telephone Encounter (Signed)
I have attempted to call Ralph Munoz with his low-dose screening CT results.  There is no answer at his mobile number, or at his home number.  There was no option to leave a message as his voicemail has not been set up on his mobile phone.  We will attempt to call him with the results of his scan again tomorrow 11/22/2016.

## 2016-11-22 ENCOUNTER — Telehealth: Payer: Self-pay | Admitting: Acute Care

## 2016-11-22 DIAGNOSIS — I272 Pulmonary hypertension, unspecified: Secondary | ICD-10-CM

## 2016-11-22 DIAGNOSIS — Z87891 Personal history of nicotine dependence: Secondary | ICD-10-CM

## 2016-11-22 DIAGNOSIS — Z122 Encounter for screening for malignant neoplasm of respiratory organs: Secondary | ICD-10-CM

## 2016-11-22 NOTE — Telephone Encounter (Signed)
I have called Ralph Munoz with the results of his low-dose screening CT.  I explained that his scan was read as a lung RADS 3. Lung  RADS 3, nodules that are probably benign findings, short term follow up suggested: includes nodules with a low likelihood of becoming a clinically active cancer. Radiology recommends a 6 month repeat LDCT follow up.  I explained that he will need follow-up imaging in 6 months, and that we will order and schedule that for April 2019.  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  I let him know that I would fax the results of the scan to his PCP Loma Boston.  And we will let her decide best follow-up options.  I also encouraged him to follow-up with her regarding these findings.  Patient verbalized understanding of the above and had no questions at completion of the call.  Langley Gauss please place order for follow-up low-dose screening CT for May 19, 2017.  This is a Friday, which the patient needs due to his work schedule.  I will fax results to PCP.  Thank you so much

## 2016-11-24 NOTE — Telephone Encounter (Signed)
F/U CT ordered for 05/19/17 due to pt's work schedule.

## 2016-11-30 ENCOUNTER — Telehealth: Payer: Self-pay | Admitting: Primary Care

## 2016-11-30 NOTE — Telephone Encounter (Signed)
Please notify patient that I was contacted by Ralph Munoz with pulmonology who notified me of the results of his recent CT chest for lung cancer screening. Please have him scheduled for an office visit with me to discuss high blood pressure treatment. Please have him bring his BP logs to this appointment.

## 2016-12-01 NOTE — Telephone Encounter (Signed)
Spoken and notified patient of Kate's comments. Patient verbalized understanding.  Office visit has been scheduled on 12/09/2016

## 2016-12-09 ENCOUNTER — Encounter: Payer: Self-pay | Admitting: Primary Care

## 2016-12-09 ENCOUNTER — Ambulatory Visit (AMBULATORY_SURGERY_CENTER): Payer: Self-pay

## 2016-12-09 ENCOUNTER — Other Ambulatory Visit: Payer: Self-pay

## 2016-12-09 ENCOUNTER — Ambulatory Visit (INDEPENDENT_AMBULATORY_CARE_PROVIDER_SITE_OTHER): Payer: Commercial Managed Care - PPO | Admitting: Primary Care

## 2016-12-09 VITALS — Ht 70.0 in | Wt 246.6 lb

## 2016-12-09 DIAGNOSIS — G8929 Other chronic pain: Secondary | ICD-10-CM

## 2016-12-09 DIAGNOSIS — I700 Atherosclerosis of aorta: Secondary | ICD-10-CM

## 2016-12-09 DIAGNOSIS — M545 Low back pain: Secondary | ICD-10-CM

## 2016-12-09 DIAGNOSIS — Z1211 Encounter for screening for malignant neoplasm of colon: Secondary | ICD-10-CM

## 2016-12-09 DIAGNOSIS — F528 Other sexual dysfunction not due to a substance or known physiological condition: Secondary | ICD-10-CM

## 2016-12-09 DIAGNOSIS — I10 Essential (primary) hypertension: Secondary | ICD-10-CM

## 2016-12-09 MED ORDER — NA SULFATE-K SULFATE-MG SULF 17.5-3.13-1.6 GM/177ML PO SOLN: 1.0000 | Freq: Once | ORAL | 0 refills | Status: AC

## 2016-12-09 MED ORDER — SILDENAFIL CITRATE 20 MG PO TABS: ORAL_TABLET | ORAL | 0 refills | Status: AC

## 2016-12-09 MED ORDER — LISINOPRIL-HYDROCHLOROTHIAZIDE 20-12.5 MG PO TABS: 1.0000 | ORAL_TABLET | Freq: Every day | ORAL | 0 refills | Status: DC

## 2016-12-09 NOTE — Assessment & Plan Note (Signed)
Suspect cooler temperatures have caused an increased flare. Using meds PRN. Offered orthopedic evaluation vs physical therapy, he kindly declines.

## 2016-12-09 NOTE — Assessment & Plan Note (Signed)
Discussed different option for treatment. Suspect ED will worsen with initiation of anti-hypertensive treatment. Rx for sildenafil 20 mg tablets sent to pharmacy. Discussed directions for use and to wait to start for 2 weeks to allow BP meds to take effect.

## 2016-12-09 NOTE — Addendum Note (Signed)
Addended by: Jannette Spanner on: 12/09/2016 03:45 PM   Modules accepted: Orders

## 2016-12-09 NOTE — Assessment & Plan Note (Signed)
Above goal today and on numerous office visits. Rx for lisinopril-HCTZ 20-12.5 mg tablets sent to pharmacy. Discussed the importance of a healthy diet and regular exercise in order for weight loss, and to reduce the risk of other medical problems. Follow up in 2-3 weeks for recheck and BMP.
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
## 2016-12-09 NOTE — Patient Instructions (Signed)
Start aspirin 81 mg once daily. This can be purchased over the counter.  Start lisinopril-hydrochlorothiazide 20-12.5 mg tablets for high blood pressure. Take 1 tablet by mouth once daily. Monitor your blood pressure if possible.  You may take sildenafil 20 mg tablets for erectile dysfunction. Take 2-5 tablets by mouth 30 minutes prior to intercourse. Please do not start this for 2 weeks to allow for the blood pressure medication to start taking effect.  Schedule a follow up visit in 2-3 weeks for re-evaluation.  It was a pleasure to see you today!   DASH Eating Plan DASH stands for "Dietary Approaches to Stop Hypertension." The DASH eating plan is a healthy eating plan that has been shown to reduce high blood pressure (hypertension). It may also reduce your risk for type 2 diabetes, heart disease, and stroke. The DASH eating plan may also help with weight loss. What are tips for following this plan? General guidelines  Avoid eating more than 2,300 mg (milligrams) of salt (sodium) a day. If you have hypertension, you may need to reduce your sodium intake to 1,500 mg a day.  Limit alcohol intake to no more than 1 drink a day for nonpregnant women and 2 drinks a day for men. One drink equals 12 oz of beer, 5 oz of wine, or 1 oz of hard liquor.  Work with your health care provider to maintain a healthy body weight or to lose weight. Ask what an ideal weight is for you.  Get at least 30 minutes of exercise that causes your heart to beat faster (aerobic exercise) most days of the week. Activities may include walking, swimming, or biking.  Work with your health care provider or diet and nutrition specialist (dietitian) to adjust your eating plan to your individual calorie needs. Reading food labels  Check food labels for the amount of sodium per serving. Choose foods with less than 5 percent of the Daily Value of sodium. Generally, foods with less than 300 mg of sodium per serving fit into this  eating plan.  To find whole grains, look for the word "whole" as the first word in the ingredient list. Shopping  Buy products labeled as "low-sodium" or "no salt added."  Buy fresh foods. Avoid canned foods and premade or frozen meals. Cooking  Avoid adding salt when cooking. Use salt-free seasonings or herbs instead of table salt or sea salt. Check with your health care provider or pharmacist before using salt substitutes.  Do not fry foods. Cook foods using healthy methods such as baking, boiling, grilling, and broiling instead.  Cook with heart-healthy oils, such as olive, canola, soybean, or sunflower oil. Meal planning   Eat a balanced diet that includes: ? 5 or more servings of fruits and vegetables each day. At each meal, try to fill half of your plate with fruits and vegetables. ? Up to 6-8 servings of whole grains each day. ? Less than 6 oz of lean meat, poultry, or fish each day. A 3-oz serving of meat is about the same size as a deck of cards. One egg equals 1 oz. ? 2 servings of low-fat dairy each day. ? A serving of nuts, seeds, or beans 5 times each week. ? Heart-healthy fats. Healthy fats called Omega-3 fatty acids are found in foods such as flaxseeds and coldwater fish, like sardines, salmon, and mackerel.  Limit how much you eat of the following: ? Canned or prepackaged foods. ? Food that is high in trans fat, such as  fried foods. ? Food that is high in saturated fat, such as fatty meat. ? Sweets, desserts, sugary drinks, and other foods with added sugar. ? Full-fat dairy products.  Do not salt foods before eating.  Try to eat at least 2 vegetarian meals each week.  Eat more home-cooked food and less restaurant, buffet, and fast food.  When eating at a restaurant, ask that your food be prepared with less salt or no salt, if possible. What foods are recommended? The items listed may not be a complete list. Talk with your dietitian about what dietary choices  are best for you. Grains Whole-grain or whole-wheat bread. Whole-grain or whole-wheat pasta. Brown rice. [place]. Bulgur. Whole-grain and low-sodium cereals. Pita bread. Low-fat, low-sodium crackers. Whole-wheat flour tortillas. Vegetables Fresh or frozen vegetables (raw, steamed, roasted, or grilled). Low-sodium or reduced-sodium tomato and vegetable juice. Low-sodium or reduced-sodium tomato sauce and tomato paste. Low-sodium or reduced-sodium canned vegetables. Fruits All fresh, dried, or frozen fruit. Canned fruit in natural juice (without added sugar). Meat and other protein foods Skinless chicken or [place]. Ground chicken or [place]. Pork with fat trimmed off. Fish and seafood. Egg whites. Dried beans, peas, or lentils. Unsalted nuts, nut butters, and seeds. Unsalted canned beans. Lean cuts of beef with fat trimmed off. Low-sodium, lean deli meat. Dairy Low-fat (1%) or fat-free (skim) milk. Fat-free, low-fat, or reduced-fat cheeses. Nonfat, low-sodium ricotta or cottage cheese. Low-fat or nonfat yogurt. Low-fat, low-sodium cheese. Fats and oils Soft margarine without trans fats. Vegetable oil. Low-fat, reduced-fat, or light mayonnaise and salad dressings (reduced-sodium). Canola, safflower, olive, soybean, and sunflower oils. Avocado. Seasoning and other foods Herbs. Spices. Seasoning mixes without salt. Unsalted popcorn and pretzels. Fat-free sweets. What foods are not recommended? The items listed may not be a complete list. Talk with your dietitian about what dietary choices are best for you. Grains Baked goods made with fat, such as croissants, muffins, or some breads. Dry pasta or rice meal packs. Vegetables Creamed or fried vegetables. Vegetables in a cheese sauce. Regular canned vegetables (not low-sodium or reduced-sodium). Regular canned tomato sauce and paste (not low-sodium or reduced-sodium). Regular tomato and vegetable juice (not low-sodium or reduced-sodium). Angie Fava.  Olives. Fruits Canned fruit in a light or heavy syrup. Fried fruit. Fruit in cream or butter sauce. Meat and other protein foods Fatty cuts of meat. Ribs. Fried meat. Berniece Salines. Sausage. Bologna and other processed lunch meats. Salami. Fatback. Hotdogs. Bratwurst. Salted nuts and seeds. Canned beans with added salt. Canned or smoked fish. Whole eggs or egg yolks. Chicken or [place] with skin. Dairy Whole or 2% milk, cream, and half-and-half. Whole or full-fat cream cheese. Whole-fat or sweetened yogurt. Full-fat cheese. Nondairy creamers. Whipped toppings. Processed cheese and cheese spreads. Fats and oils Butter. Stick margarine. Lard. Shortening. Ghee. Bacon fat. Tropical oils, such as coconut, palm kernel, or palm oil. Seasoning and other foods Salted popcorn and pretzels. Onion salt, garlic salt, seasoned salt, table salt, and sea salt. Worcestershire sauce. Tartar sauce. Barbecue sauce. Teriyaki sauce. Soy sauce, including reduced-sodium. Steak sauce. Canned and packaged gravies. Fish sauce. Oyster sauce. Cocktail sauce. Horseradish that you find on the shelf. Ketchup. Mustard. Meat flavorings and tenderizers. Bouillon cubes. Hot sauce and Tabasco sauce. Premade or packaged marinades. Premade or packaged taco seasonings. Relishes. Regular salad dressings. Where to find more information:  National Heart, Lung, and [place]: https://wilson-eaton.com/  American Heart Association: www.heart.org Summary  The DASH eating plan is a healthy eating plan that has been shown to  reduce high blood pressure (hypertension). It may also reduce your risk for type 2 diabetes, heart disease, and stroke.  With the DASH eating plan, you should limit salt (sodium) intake to 2,300 mg a day. If you have hypertension, you may need to reduce your sodium intake to 1,500 mg a day.  When on the DASH eating plan, aim to eat more fresh fruits and vegetables, whole grains, lean proteins, low-fat dairy, and heart-healthy  fats.  Work with your health care provider or diet and nutrition specialist (dietitian) to adjust your eating plan to your individual calorie needs. This information is not intended to replace advice given to you by your health care provider. Make sure you discuss any questions you have with your health care provider. Document Released: 12/30/2010 Document Revised: 01/04/2016 Document Reviewed: 01/04/2016 Elsevier Interactive Patient Education  2017 [employer].

## 2016-12-09 NOTE — Assessment & Plan Note (Signed)
Start aspirin 81 mg once daily. Discussed to avoid recurrent use of other NSAID's. Lipid panel unremarkable in September 2018. Discussed the importance of a healthy diet and regular exercise in order for weight loss, and to reduce the risk of other medical problems.

## 2016-12-12 ENCOUNTER — Ambulatory Visit: Payer: Commercial Managed Care - PPO | Admitting: Primary Care

## 2016-12-29 ENCOUNTER — Encounter: Payer: Self-pay | Admitting: Primary Care

## 2016-12-29 ENCOUNTER — Other Ambulatory Visit: Payer: Self-pay | Admitting: Primary Care

## 2016-12-29 ENCOUNTER — Ambulatory Visit (INDEPENDENT_AMBULATORY_CARE_PROVIDER_SITE_OTHER): Payer: Commercial Managed Care - PPO | Admitting: Primary Care

## 2016-12-29 VITALS — BP 126/82 | HR 84 | Temp 98.1°F | Ht 70.0 in | Wt 242.4 lb

## 2016-12-29 DIAGNOSIS — I10 Essential (primary) hypertension: Secondary | ICD-10-CM

## 2016-12-29 DIAGNOSIS — G8929 Other chronic pain: Secondary | ICD-10-CM

## 2016-12-29 DIAGNOSIS — E875 Hyperkalemia: Secondary | ICD-10-CM

## 2016-12-29 DIAGNOSIS — M545 Low back pain, unspecified: Secondary | ICD-10-CM

## 2016-12-29 LAB — BASIC METABOLIC PANEL
BUN: 25 mg/dL — ABNORMAL HIGH (ref 6–23)
CO2: 30 mEq/L (ref 19–32)
Calcium: 9.8 mg/dL (ref 8.4–10.5)
Chloride: 101 mEq/L (ref 96–112)
Creatinine, Ser: 1.04 mg/dL (ref 0.40–1.50)
GFR: 77.13 mL/min (ref >60.00)
Glucose, Bld: 101 mg/dL — ABNORMAL HIGH (ref 70–99)
POTASSIUM: 5.5 meq/L — AB (ref 3.5–5.1)
SODIUM: 139 meq/L (ref 135–145)

## 2016-12-29 MED ORDER — CELECOXIB 100 MG PO CAPS: 100.0000 mg | ORAL_CAPSULE | Freq: Two times a day (BID) | ORAL | 1 refills | Status: DC

## 2016-12-29 NOTE — Assessment & Plan Note (Addendum)
No improvement with cyclobenzaprine and diclofenac. Endorses he did well on Celebrex in the past.  Discussed to avoid other NSAID's with this medication, he verbalized understanding. Also discussed potential side effects. Will start at 100 mg BID, he will update in a few weeks. Discussed that I will not prescribe narcotics.
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
## 2016-12-29 NOTE — Assessment & Plan Note (Signed)
Improved on lisinopril-HCTZ, continue same. BMP pending today.  Recommended healthy lifestyle changes in diet and exercise.

## 2016-12-29 NOTE — Patient Instructions (Addendum)
Start celebrex at 100 mg once or twice daily as needed for pain.  Do not take any Ibuprofen, Advil, Motrin, naproxen, Aleve when taking this medication.  Complete lab work prior to leaving today. I will notify you of your results once received.   Schedule a lab only appointment to recheck your kidney function in 3 months.  It was a pleasure to see you today!   Back Exercises The following exercises strengthen the muscles that help to support the back. They also help to keep the lower back flexible. Doing these exercises can help to prevent back pain or lessen existing pain. If you have back pain or discomfort, try doing these exercises 2-3 times each day or as told by your health care provider. When the pain goes away, do them once each day, but increase the number of times that you repeat the steps for each exercise (do more repetitions). If you do not have back pain or discomfort, do these exercises once each day or as told by your health care provider. Exercises Single Knee to Chest  Repeat these steps 3-5 times for each leg: 1. Lie on your back on a firm bed or the floor with your legs extended. 2. Bring one knee to your chest. Your other leg should stay extended and in contact with the floor. 3. Hold your knee in place by grabbing your knee or thigh. 4. Pull on your knee until you feel a gentle stretch in your lower back. 5. Hold the stretch for 10-30 seconds. 6. Slowly release and straighten your leg.  Pelvic Tilt  Repeat these steps 5-10 times: 1. Lie on your back on a firm bed or the floor with your legs extended. 2. Bend your knees so they are pointing toward the ceiling and your feet are flat on the floor. 3. Tighten your lower abdominal muscles to press your lower back against the floor. This motion will tilt your pelvis so your tailbone points up toward the ceiling instead of pointing to your feet or the floor. 4. With gentle tension and even breathing, hold this position  for 5-10 seconds.  Cat-Cow  Repeat these steps until your lower back becomes more flexible: 1. Get into a hands-and-knees position on a firm surface. Keep your hands under your shoulders, and keep your knees under your hips. You may place padding under your knees for comfort. 2. Let your head hang down, and point your tailbone toward the floor so your lower back becomes rounded like the back of a cat. 3. Hold this position for 5 seconds. 4. Slowly lift your head and point your tailbone up toward the ceiling so your back forms a sagging arch like the back of a cow. 5. Hold this position for 5 seconds.  Press-Ups  Repeat these steps 5-10 times: 1. Lie on your abdomen (face-down) on the floor. 2. Place your palms near your head, about shoulder-width apart. 3. While you keep your back as relaxed as possible and keep your hips on the floor, slowly straighten your arms to raise the top half of your body and lift your shoulders. Do not use your back muscles to raise your upper torso. You may adjust the placement of your hands to make yourself more comfortable. 4. Hold this position for 5 seconds while you keep your back relaxed. 5. Slowly return to lying flat on the floor.  Bridges  Repeat these steps 10 times: 1. Lie on your back on a firm surface. 2. [place] your  knees so they are pointing toward the ceiling and your feet are flat on the floor. 3. Tighten your buttocks muscles and lift your buttocks off of the floor until your waist is at almost the same height as your knees. You should feel the muscles working in your buttocks and the back of your thighs. If you do not feel these muscles, slide your feet 1-2 inches farther away from your buttocks. 4. Hold this position for 3-5 seconds. 5. Slowly lower your hips to the starting position, and allow your buttocks muscles to relax completely.  If this exercise is too easy, try doing it with your arms crossed over your chest. Abdominal  Crunches  Repeat these steps 5-10 times: 1. Lie on your back on a firm bed or the floor with your legs extended. 2. Bend your knees so they are pointing toward the ceiling and your feet are flat on the floor. 3. Cross your arms over your chest. 4. Tip your chin slightly toward your chest without bending your neck. 5. Tighten your abdominal muscles and slowly raise your trunk (torso) high enough to lift your shoulder blades a tiny bit off of the floor. Avoid raising your torso higher than that, because it can put too much stress on your low back and it does not help to strengthen your abdominal muscles. 6. Slowly return to your starting position.  Back Lifts Repeat these steps 5-10 times: 1. Lie on your abdomen (face-down) with your arms at your sides, and rest your forehead on the floor. 2. Tighten the muscles in your legs and your buttocks. 3. Slowly lift your chest off of the floor while you keep your hips pressed to the floor. Keep the back of your head in line with the curve in your back. Your eyes should be looking at the floor. 4. Hold this position for 3-5 seconds. 5. Slowly return to your starting position.  Contact a health care provider if:  Your back pain or discomfort gets much worse when you do an exercise.  Your back pain or discomfort does not lessen within 2 hours after you exercise. If you have any of these problems, stop doing these exercises right away. Do not do them again unless your health care provider says that you can. Get help right away if:  You develop sudden, severe back pain. If this happens, stop doing the exercises right away. Do not do them again unless your health care provider says that you can. This information is not intended to replace advice given to you by your health care provider. Make sure you discuss any questions you have with your health care provider. Document Released: 02/18/2004 Document Revised: 05/20/2015 Document Reviewed:  03/06/2014 Elsevier Interactive Patient Education  2017 [employer].

## 2016-12-30 ENCOUNTER — Encounter: Payer: Self-pay | Admitting: Gastroenterology

## 2016-12-30 ENCOUNTER — Other Ambulatory Visit: Payer: Self-pay

## 2016-12-30 ENCOUNTER — Ambulatory Visit (AMBULATORY_SURGERY_CENTER): Payer: Commercial Managed Care - PPO | Admitting: Gastroenterology

## 2016-12-30 VITALS — BP 131/92 | HR 72 | Temp 98.2°F | Resp 17 | Ht 69.0 in | Wt 249.0 lb

## 2016-12-30 DIAGNOSIS — D122 Benign neoplasm of ascending colon: Secondary | ICD-10-CM

## 2016-12-30 DIAGNOSIS — Z1211 Encounter for screening for malignant neoplasm of colon: Secondary | ICD-10-CM | POA: Diagnosis present

## 2016-12-30 DIAGNOSIS — K6389 Other specified diseases of intestine: Secondary | ICD-10-CM | POA: Diagnosis not present

## 2016-12-30 DIAGNOSIS — K621 Rectal polyp: Secondary | ICD-10-CM | POA: Diagnosis not present

## 2016-12-30 DIAGNOSIS — D128 Benign neoplasm of rectum: Secondary | ICD-10-CM

## 2016-12-30 DIAGNOSIS — D125 Benign neoplasm of sigmoid colon: Secondary | ICD-10-CM

## 2016-12-30 DIAGNOSIS — D129 Benign neoplasm of anus and anal canal: Secondary | ICD-10-CM

## 2016-12-30 MED ORDER — SODIUM CHLORIDE 0.9 % IV SOLN: 500.0000 mL | Freq: Once | INTRAVENOUS | Status: DC

## 2016-12-30 NOTE — Progress Notes (Signed)
Called to room to assist during endoscopic procedure.  Patient ID and intended procedure confirmed with present staff. Received instructions for my participation in the procedure from the performing physician.  

## 2016-12-30 NOTE — Progress Notes (Signed)
To PACU, VSS. Report to [email or web address] 

## 2016-12-30 NOTE — Progress Notes (Signed)
Pt's states no medical or surgical changes since previsit or office visit. 

## 2016-12-30 NOTE — Patient Instructions (Signed)
YOU HAD AN ENDOSCOPIC PROCEDURE TODAY AT [facility]:   Refer to the procedure report that was given to you for any specific questions about what was found during the examination.  If the procedure report does not answer your questions, please call your gastroenterologist to clarify.  If you requested that your care partner not be given the details of your procedure findings, then the procedure report has been included in a sealed envelope for you to review at your convenience later.  YOU SHOULD EXPECT: Some feelings of bloating in the abdomen. Passage of more gas than usual.  Walking can help get rid of the air that was put into your GI tract during the procedure and reduce the bloating. If you had a lower endoscopy (such as a colonoscopy or flexible sigmoidoscopy) you may notice spotting of blood in your stool or on the toilet paper. If you underwent a bowel prep for your procedure, you may not have a normal bowel movement for a few days.  Please Note:  You might notice some irritation and congestion in your nose or some drainage.  This is from the oxygen used during your procedure.  There is no need for concern and it should clear up in a day or so.  SYMPTOMS TO REPORT IMMEDIATELY:   Following lower endoscopy (colonoscopy or flexible sigmoidoscopy):  Excessive amounts of blood in the stool  Significant tenderness or worsening of abdominal pains  Swelling of the abdomen that is new, acute  Fever of 100F or higher    For urgent or emergent issues, a gastroenterologist can be reached at any hour by calling [phone].   DIET:  We do recommend a small meal at first, but then you may proceed to your regular diet.  Drink plenty of fluids but you should avoid alcoholic beverages for 24 hours.  ACTIVITY:  You should plan to take it easy for the rest of today and you should NOT DRIVE or use heavy machinery until tomorrow (because of the sedation medicines used during the test).     FOLLOW UP: Our staff will call the number listed on your records the next business day following your procedure to check on you and address any questions or concerns that you may have regarding the information given to you following your procedure. If we do not reach you, we will leave a message.  However, if you are feeling well and you are not experiencing any problems, there is no need to return our call.  We will assume that you have returned to your regular daily activities without incident.  If any biopsies were taken you will be contacted by phone or by letter within the next 1-3 weeks.  Please call us at [phone] if you have not heard about the biopsies in 3 weeks.    SIGNATURES/CONFIDENTIALITY: You and/or your care partner have signed paperwork which will be entered into your electronic medical record.  These signatures attest to the fact that that the information above on your After Visit Summary has been reviewed and is understood.  Full responsibility of the confidentiality of this discharge information lies with you and/or your care-partner.   No ibuprofen,naproxen,or other non-steroidal anti-inflammatory drugs for 2 weeks resume remainder of medication. Information given on polyps,diverticulosis and hemorrhoids.

## 2016-12-30 NOTE — Op Note (Signed)
[place] Patient Name: Ralph Munoz Procedure Date: 12/30/2016 11:28 AM MRN: [id number] Endoscopist: Remo Lipps P. Osmani Kersten MD, MD Age: 61 Referring MD:  Date of Birth: 1955-05-04 Gender: Male Account #: [id number] Procedure:                Colonoscopy Indications:              Screening for colorectal malignant neoplasm, This                            is the patient's first colonoscopy Medicines:                Monitored Anesthesia Care Procedure:                Pre-Anesthesia Assessment:                           - Prior to the procedure, a History and Physical                            was performed, and patient medications and                            allergies were reviewed. The patient's tolerance of                            previous anesthesia was also reviewed. The risks                            and benefits of the procedure and the sedation                            options and risks were discussed with the patient.                            All questions were answered, and informed consent                            was obtained. Prior Anticoagulants: The patient has                            taken no previous anticoagulant or antiplatelet                            agents. ASA Grade Assessment: II - A patient with                            mild systemic disease. After reviewing the risks                            and benefits, the patient was deemed in                            satisfactory condition to undergo the procedure.  After obtaining informed consent, the colonoscope                            was passed under direct vision. Throughout the                            procedure, the patient's blood pressure, pulse, and                            oxygen saturations were monitored continuously. The                            Colonoscope was introduced through the anus and                            advanced to the the  cecum, identified by                            appendiceal orifice and ileocecal valve. The                            colonoscopy was performed without difficulty. The                            patient tolerated the procedure well. The quality                            of the bowel preparation was good. The ileocecal                            valve, appendiceal orifice, and rectum were                            photographed. Scope In: 11:36:05 AM Scope Out: 11:57:40 AM Scope Withdrawal Time: 0 hours 20 minutes 0 seconds  Total Procedure Duration: 0 hours 21 minutes 35 seconds  Findings:                 The perianal and digital rectal examinations were                            normal.                           A single angiodysplastic lesion was found in the                            ascending colon.                           A 8 mm polyp was found in the ascending colon. The                            polyp was sessile. The polyp was removed with a  cold snare. Resection and retrieval were complete.                           A 4 mm polyp was found in the sigmoid colon,                            located within what appeared to be a diverticulum.                            The polyp was sessile and seemed ulcerated, most                            consistent with an inflammatory polyp. Biopsies                            were taken with a cold forceps for histology to                            rule out adenomatous changes, in doing so it                            appeared the polyp was mostly removed.                           A 3 mm polyp was found in the rectum. The polyp was                            sessile. The polyp was removed with a cold biopsy                            forceps. Resection and retrieval were complete.                           Multiple medium-mouthed diverticula were found in                            the left colon.                            Internal hemorrhoids were found during retroflexion.                           The exam was otherwise without abnormality. Of                            note, I thought a photo of the AO was taken but it                            was not. The AO was normal. Complications:            No immediate complications. Estimated blood loss:  Minimal. Estimated Blood Loss:     Estimated blood loss was minimal. Impression:               - A single colonic angiodysplastic lesion.                           - One 8 mm polyp in the ascending colon, removed                            with a cold snare. Resected and retrieved.                           - One 4 mm polyp in the sigmoid colon within a                            diverticulum, suspect inflammatory polyp. Biopsied.                           - One 3 mm polyp in the rectum, removed with a cold                            biopsy forceps. Resected and retrieved.                           - Diverticulosis in the left colon.                           - Internal hemorrhoids.                           - The examination was otherwise normal. Recommendation:           - Patient has a contact number available for                            emergencies. The signs and symptoms of potential                            delayed complications were discussed with the                            patient. Return to normal activities tomorrow.                            Written discharge instructions were provided to the                            patient.                           - Resume previous diet.                           - Continue present medications.                           -  Await pathology results.                           - Repeat colonoscopy is recommended for                            surveillance. The colonoscopy date will be                            determined after pathology results from today's                             exam become available for review.                           - No ibuprofen, naproxen, or other non-steroidal                            anti-inflammatory drugs for 2 weeks after polyp                            removal. Remo Lipps P. Derica Leiber MD, MD 12/30/2016 12:04:03 PM This report has been signed electronically.

## 2017-01-03 ENCOUNTER — Encounter: Payer: Self-pay | Admitting: Gastroenterology

## 2017-01-04 ENCOUNTER — Telehealth: Payer: Self-pay | Admitting: *Deleted

## 2017-01-04 NOTE — Telephone Encounter (Signed)
  Follow up Call-  Call back number 12/30/2016  Post procedure Call Back phone  # [phone]  Permission to leave phone message Yes  Some recent data might be hidden     Patient questions:  Do you have a fever, pain , or abdominal swelling? No. Pain Score  0 *  Have you tolerated food without any problems? Yes.    Have you been able to return to your normal activities? Yes.    Do you have any questions about your discharge instructions: Diet   No. Medications  No. Follow up visit  No.  Do you have questions or concerns about your Care? No.  Actions: * If pain score is 4 or above: No action needed, pain <4.

## 2017-01-05 ENCOUNTER — Other Ambulatory Visit: Payer: Self-pay | Admitting: *Deleted

## 2017-01-05 ENCOUNTER — Encounter: Payer: Self-pay | Admitting: *Deleted

## 2017-01-05 DIAGNOSIS — I10 Essential (primary) hypertension: Secondary | ICD-10-CM

## 2017-01-05 MED ORDER — LISINOPRIL-HYDROCHLOROTHIAZIDE 20-12.5 MG PO TABS: 1.0000 | ORAL_TABLET | Freq: Every day | ORAL | 0 refills | Status: DC

## 2017-02-01 ENCOUNTER — Other Ambulatory Visit: Payer: Self-pay

## 2017-02-01 DIAGNOSIS — I272 Pulmonary hypertension, unspecified: Secondary | ICD-10-CM

## 2017-02-05 ENCOUNTER — Other Ambulatory Visit: Payer: Self-pay | Admitting: Primary Care

## 2017-02-05 DIAGNOSIS — I10 Essential (primary) hypertension: Secondary | ICD-10-CM

## 2017-02-06 NOTE — Telephone Encounter (Signed)
Please notify patient that he needs his potassium rechecked as soon as possible as the lisinopril/HCTZ can cause retention of potassium.  His last potassium level was too high so we need to make sure the medication is not causing this.  Will only send in 30-day supply until he has a lab check.

## 2017-02-06 NOTE — Telephone Encounter (Signed)
Electronic refill request See lab report 12/29/16 Lab appointment scheduled for 03/31/17

## 2017-02-08 NOTE — Telephone Encounter (Signed)
Message left for patient to return my call.  

## 2017-02-10 NOTE — Telephone Encounter (Signed)
Message left for patient to return my call.  

## 2017-02-14 NOTE — Telephone Encounter (Signed)
Letter send to patient to call and schedule lab appointment as soon as possible.

## 2017-02-24 ENCOUNTER — Encounter: Payer: Self-pay | Admitting: Pulmonary Disease

## 2017-02-24 ENCOUNTER — Ambulatory Visit (INDEPENDENT_AMBULATORY_CARE_PROVIDER_SITE_OTHER): Payer: Commercial Managed Care - PPO | Admitting: Pulmonary Disease

## 2017-02-24 ENCOUNTER — Other Ambulatory Visit (INDEPENDENT_AMBULATORY_CARE_PROVIDER_SITE_OTHER): Payer: Commercial Managed Care - PPO

## 2017-02-24 VITALS — BP 186/100 | HR 90 | Ht 70.0 in | Wt 243.2 lb

## 2017-02-24 DIAGNOSIS — I10 Essential (primary) hypertension: Secondary | ICD-10-CM | POA: Diagnosis not present

## 2017-02-24 DIAGNOSIS — R0683 Snoring: Secondary | ICD-10-CM | POA: Diagnosis not present

## 2017-02-24 DIAGNOSIS — I272 Pulmonary hypertension, unspecified: Secondary | ICD-10-CM

## 2017-02-24 LAB — BASIC METABOLIC PANEL
BUN: 19 mg/dL (ref 6–23)
CHLORIDE: 104 meq/L (ref 96–112)
CO2: 28 mEq/L (ref 19–32)
CREATININE: 1.09 mg/dL (ref 0.40–1.50)
Calcium: 9.6 mg/dL (ref 8.4–10.5)
GFR: 73.02 mL/min (ref >60.00)
Glucose, Bld: 88 mg/dL (ref 70–99)
POTASSIUM: 4.9 meq/L (ref 3.5–5.1)
Sodium: 140 mEq/L (ref 135–145)

## 2017-02-24 MED ORDER — LISINOPRIL-HYDROCHLOROTHIAZIDE 20-12.5 MG PO TABS: 1.0000 | ORAL_TABLET | Freq: Every day | ORAL | 0 refills | Status: DC
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
## 2017-02-24 NOTE — Patient Instructions (Signed)
We will check your labs today and restart you on lisinopril, hydrochlorothiazide Please follow-up with your primary care physician for blood pressure management We will schedule you for pulmonary function test, echocardiogram to evaluate for pulmonary hypertension and split-night sleep study Follow-up in 1-2 months.

## 2017-03-17 ENCOUNTER — Other Ambulatory Visit: Payer: Self-pay

## 2017-03-17 ENCOUNTER — Encounter: Payer: Self-pay | Admitting: Primary Care

## 2017-03-17 ENCOUNTER — Encounter (HOSPITAL_BASED_OUTPATIENT_CLINIC_OR_DEPARTMENT_OTHER): Payer: Commercial Managed Care - PPO

## 2017-03-17 ENCOUNTER — Ambulatory Visit (HOSPITAL_COMMUNITY): Payer: Commercial Managed Care - PPO | Attending: Cardiovascular Disease

## 2017-03-17 ENCOUNTER — Ambulatory Visit (INDEPENDENT_AMBULATORY_CARE_PROVIDER_SITE_OTHER): Payer: Commercial Managed Care - PPO | Admitting: Primary Care

## 2017-03-17 DIAGNOSIS — I270 Primary pulmonary hypertension: Secondary | ICD-10-CM | POA: Diagnosis present

## 2017-03-17 DIAGNOSIS — I272 Pulmonary hypertension, unspecified: Secondary | ICD-10-CM

## 2017-03-17 DIAGNOSIS — I10 Essential (primary) hypertension: Secondary | ICD-10-CM

## 2017-03-17 DIAGNOSIS — I119 Hypertensive heart disease without heart failure: Secondary | ICD-10-CM | POA: Insufficient documentation

## 2017-03-17 LAB — BASIC METABOLIC PANEL
BUN: 23 mg/dL (ref 6–23)
CO2: 32 mEq/L (ref 19–32)
CREATININE: 1.02 mg/dL (ref 0.40–1.50)
Calcium: 10.4 mg/dL (ref 8.4–10.5)
Chloride: 101 mEq/L (ref 96–112)
GFR: 78.82 mL/min (ref >60.00)
Glucose, Bld: 77 mg/dL (ref 70–99)
Potassium: 5 mEq/L (ref 3.5–5.1)
Sodium: 140 mEq/L (ref 135–145)

## 2017-03-17 LAB — ECHOCARDIOGRAM COMPLETE
HEIGHTINCHES: 70 in
WEIGHTICAEL: 3920 [oz_av]

## 2017-03-17 NOTE — Patient Instructions (Signed)
Stop by the lab prior to leaving today. I will notify you of your results once received.   Continue lisinopril-HCTZ tablets for high blood pressure. I'll send refills once we get your potassium level back.  Follow up with pulmonology as requested.  It was a pleasure to see you today!
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
## 2017-03-17 NOTE — Assessment & Plan Note (Signed)
Improved since resuming lisinopril-HCTZ 20-12.5 mg. Discussed that I never recommended he stop his medication, he verablized understanding.  Repeat BMP today since resuming medication. If potasium stable them will refill medication.

## 2017-03-21 ENCOUNTER — Encounter: Payer: Self-pay | Admitting: *Deleted

## 2017-03-28 ENCOUNTER — Telehealth: Payer: Self-pay | Admitting: Pulmonary Disease

## 2017-03-28 NOTE — Telephone Encounter (Signed)
  Pt called wanting to see if he needed to come to his appt. I verified appt on 4/12 and nothing further needed.   Instructions   We will check your labs today and restart you on lisinopril, hydrochlorothiazide Please follow-up with your primary care physician for blood pressure management We will schedule you for pulmonary function test, echocardiogram to evaluate for pulmonary hypertension and split-night sleep study Follow-up in 1-2 months.

## 2017-03-31 ENCOUNTER — Other Ambulatory Visit: Payer: Commercial Managed Care - PPO

## 2017-04-19 ENCOUNTER — Telehealth: Payer: Self-pay | Admitting: Primary Care

## 2017-04-19 DIAGNOSIS — I10 Essential (primary) hypertension: Secondary | ICD-10-CM

## 2017-04-19 MED ORDER — LISINOPRIL-HYDROCHLOROTHIAZIDE 20-12.5 MG PO TABS
ORAL_TABLET | ORAL | 2 refills | Status: DC
Start: 2017-04-19 — End: 2017-04-24

## 2017-04-19 NOTE — Telephone Encounter (Signed)
Refill request  Lisinopril -  Hydrochlorothiazide  20-12.5  Mg  Last filled on 02/24/2017  by Marshell Garfinkel -  Note  States  further  Refills  Should  Be  Done  By his  PCP  LOV 03/17/2017    By   [place] of choice   [facility]  [place] in  [place]

## 2017-04-19 NOTE — Telephone Encounter (Signed)
Copied from [place]. Topic: Quick Communication - Rx Refill/Question >> Apr 19, 2017 11:50 AM Oliver Pila B wrote: Medication: lisinopril-hydrochlorothiazide (PRINZIDE,ZESTORETIC) 20-12.5 MG tablet [[id number]]  Has the patient contacted their pharmacy? Yes.   (Agent: If no, request that the patient contact the pharmacy for the refill.) Preferred Pharmacy (with phone number [facility] street name[facility] [place] Agent: Please be advised that RX refills may take up to 3 business days. We ask that you follow-up with your pharmacy.

## 2017-04-19 NOTE — Telephone Encounter (Signed)
This medication was prescribed by me initially on 12/09/16, patient stopped taking on his own. Last office visit states to resume medication. Will refill Rx. Recent BMP stable.

## 2017-04-19 NOTE — Telephone Encounter (Signed)
Please see refill request below.  It looks like this med has not been rx'd by you in the past.

## 2017-04-23 ENCOUNTER — Other Ambulatory Visit: Payer: Self-pay | Admitting: Primary Care

## 2017-04-23 DIAGNOSIS — I10 Essential (primary) hypertension: Secondary | ICD-10-CM

## 2017-04-24 MED ORDER — LISINOPRIL-HYDROCHLOROTHIAZIDE 20-12.5 MG PO TABS: ORAL_TABLET | ORAL | 2 refills | Status: DC

## 2017-04-24 NOTE — Telephone Encounter (Addendum)
Spoken and notified patient of Kate's comments. Patient verbalized understanding.  Had to re-send medication because it should have gone to [facility] in [place].

## 2017-04-24 NOTE — Addendum Note (Signed)
Addended by: Jacqualin Combes on: 04/24/2017 12:50 PM   Modules accepted: Orders

## 2017-04-24 NOTE — Telephone Encounter (Signed)
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 He would like for his bottle to say that he have 3-12 refill on it. Patient would like to talk to Alma Friendly or her CMA about this. Please call patient back, thanks.

## 2017-05-05 ENCOUNTER — Ambulatory Visit (INDEPENDENT_AMBULATORY_CARE_PROVIDER_SITE_OTHER): Payer: Commercial Managed Care - PPO | Admitting: Pulmonary Disease

## 2017-05-05 ENCOUNTER — Encounter: Payer: Self-pay | Admitting: Pulmonary Disease

## 2017-05-05 VITALS — BP 128/76 | HR 78 | Ht 70.0 in | Wt 237.0 lb

## 2017-05-05 DIAGNOSIS — I272 Pulmonary hypertension, unspecified: Secondary | ICD-10-CM | POA: Diagnosis not present

## 2017-05-05 DIAGNOSIS — Z23 Encounter for immunization: Secondary | ICD-10-CM

## 2017-05-05 DIAGNOSIS — R911 Solitary pulmonary nodule: Secondary | ICD-10-CM | POA: Insufficient documentation

## 2017-05-05 DIAGNOSIS — J439 Emphysema, unspecified: Secondary | ICD-10-CM | POA: Insufficient documentation

## 2017-05-05 LAB — PULMONARY FUNCTION TEST
DL/VA % PRED: 101 %
DL/VA: 4.65 ml/min/mmHg/L
DLCO unc % pred: 81 %
DLCO unc: 25.4 ml/min/mmHg
FEF 25-75 Post: 2.53 L/sec
FEF 25-75 Pre: 2.44 L/sec
FEF2575-%Change-Post: 3 %
FEF2575-%Pred-Post: 89 %
FEF2575-%Pred-Pre: 86 %
FEV1-%CHANGE-POST: 3 %
FEV1-%PRED-POST: 82 %
FEV1-%PRED-PRE: 80 %
FEV1-PRE: 2.77 L
FEV1-Post: 2.87 L
FEV1FVC-%Change-Post: 1 %
FEV1FVC-%Pred-Pre: 104 %
FEV6-%Change-Post: 2 %
FEV6-%PRED-PRE: 79 %
FEV6-%Pred-Post: 81 %
FEV6-POST: 3.58 L
FEV6-Pre: 3.48 L
FEV6FVC-%Change-Post: 0 %
FEV6FVC-%PRED-PRE: 105 %
FEV6FVC-%Pred-Post: 104 %
FVC-%Change-Post: 2 %
FVC-%PRED-PRE: 76 %
FVC-%Pred-Post: 78 %
FVC-POST: 3.61 L
FVC-PRE: 3.53 L
POST FEV6/FVC RATIO: 99 %
PRE FEV6/FVC RATIO: 100 %
Post FEV1/FVC ratio: 79 %
Pre FEV1/FVC ratio: 79 %
RV % PRED: 98 %
RV: 2.19 L
TLC % pred: 83 %
TLC: 5.75 L

## 2017-05-05 NOTE — Patient Instructions (Signed)
I have reviewed your echocardiogram and lung function test. Your echo shows some mild stiffening of the heart that could be from high blood pressure Continue to take your blood pressure medication, work on diet and weight loss Lung function test look okay although there is some mild emphysema on your CT scan I do not believe you will need to be on inhalers We will continue to monitor this  You have been scheduled for a follow-up CT of the chest to monitor the lung nodules Follow-up in 6 months.

## 2017-05-05 NOTE — Progress Notes (Signed)
PFT done today. 
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
## 2017-05-05 NOTE — Addendum Note (Signed)
Addended by: Maryanna Shape A on: 05/05/2017 10:42 AM   Modules accepted: Orders

## 2017-05-26 ENCOUNTER — Ambulatory Visit (INDEPENDENT_AMBULATORY_CARE_PROVIDER_SITE_OTHER)
Admission: RE | Admit: 2017-05-26 | Discharge: 2017-05-26 | Disposition: A | Discharge status: Home / Self Care | Payer: Commercial Managed Care - PPO | Source: Ambulatory Visit | Attending: Acute Care | Admitting: Acute Care

## 2017-05-26 DIAGNOSIS — Z87891 Personal history of nicotine dependence: Secondary | ICD-10-CM

## 2017-05-26 DIAGNOSIS — Z122 Encounter for screening for malignant neoplasm of respiratory organs: Secondary | ICD-10-CM

## 2017-05-26 DIAGNOSIS — R918 Other nonspecific abnormal finding of lung field: Secondary | ICD-10-CM | POA: Diagnosis not present

## 2017-06-09 ENCOUNTER — Other Ambulatory Visit: Payer: Self-pay | Admitting: Acute Care

## 2017-06-09 DIAGNOSIS — Z122 Encounter for screening for malignant neoplasm of respiratory organs: Secondary | ICD-10-CM

## 2017-06-09 DIAGNOSIS — Z87891 Personal history of nicotine dependence: Secondary | ICD-10-CM

## 2017-11-03 ENCOUNTER — Encounter: Payer: Self-pay | Admitting: Pulmonary Disease

## 2017-11-03 ENCOUNTER — Ambulatory Visit (INDEPENDENT_AMBULATORY_CARE_PROVIDER_SITE_OTHER): Payer: Commercial Managed Care - PPO | Admitting: Pulmonary Disease

## 2017-11-03 VITALS — BP 120/82 | HR 76 | Ht 69.0 in | Wt 236.6 lb

## 2017-11-03 DIAGNOSIS — J439 Emphysema, unspecified: Secondary | ICD-10-CM

## 2017-11-03 DIAGNOSIS — R911 Solitary pulmonary nodule: Secondary | ICD-10-CM | POA: Diagnosis not present

## 2017-11-03 NOTE — Patient Instructions (Signed)
I am glad you are doing well with the breathing Continue your annual CTs.  The next one is scheduled for March 2020  Will make a clinic visit as needed going forward Please call if there is any change in his symptoms.

## 2017-11-03 NOTE — Progress Notes (Signed)
Ralph Munoz    [id number]    [birth year]  Primary Care 34, Sami, MD  Referring Physician: Pleas Koch, NP [place], [place] [address]  Problem list: Follow-up for emphysema, lung nodule  HPI: 62 year old with history of hypertension, ex-smoker.  He had a lung cancer screening test which shows subcentimeter pulmonary nodule and dilated pulmonary arteries suggestive of pulmonary hypertension.  He has been referred to pulmonary clinic for further evaluation He has dyspnea on exertion when he carries 30 pound bags of horse feed.  At the base is asymptomatic.  Denies dyspnea at rest, cough, sputum production.  No fevers, chills, hemoptysis.  He has snoring at night.  Denies daytime sleepiness.  Pets: Horses, dogs.  No birds, farm animals Occupation: Administrator.  Exposures: No known exposures, no mold, hot tub Smoking history: 30-pack-year smoking history.  Quit in March 2018 Travel History: Moves all over the country for his work.  Lived in [place]  For 30 years. Previously lived in [place], [place], [place]  Interim history: He is doing well with regard to his breathing.  Not on any inhalers His chief complaint is low back pain.  Outpatient Encounter Medications as of 11/03/2017  Medication Sig  . atenolol (TENORMIN) 25 MG tablet Take 25 mg by mouth daily.  . cyclobenzaprine (FLEXERIL) 10 MG tablet Take 10 mg by mouth at bedtime.  Marland Kitchen HYDROcodone-acetaminophen (NORCO/VICODIN) 5-325 MG tablet Take 1 tablet by mouth 3 (three) times daily as needed for moderate pain.  Marland Kitchen lisinopril-hydrochlorothiazide (PRINZIDE,ZESTORETIC) 20-25 MG tablet Take 1 tablet by mouth daily.  . sildenafil (REVATIO) 20 MG tablet Take 2-5 tablets by mouth 30 minutes prior to intercourse.  . lisinopril-hydrochlorothiazide (PRINZIDE,ZESTORETIC) 20-12.5 MG tablet TAKE 1 TABLET BY MOUTH ONCE DAILY (Patient not taking: Reported on 11/03/2017)   No  facility-administered encounter medications on file as of 11/03/2017.    Physical Exam: Blood pressure 120/82, pulse 76, height 5\' 9"  (1.753 m), weight 236 lb 9.6 oz (107.3 kg), SpO2 97 %. Gen:      No acute distress HEENT:  EOMI, sclera anicteric Neck:     No masses; no thyromegaly Lungs:    Clear to auscultation bilaterally; normal respiratory effort CV:         Regular rate and rhythm; no murmurs Abd:      + bowel sounds; soft, non-tender; no palpable masses, no distension Ext:    No edema; adequate peripheral perfusion Skin:      Warm and dry; no rash Neuro: alert and oriented x 3 Psych: normal mood and affect  Data Reviewed: Imaging CT chest 11/18/16-mild to moderate emphysema, multiple bilateral subcentimeter pulmonary nodule.  Enlarged pulmonary artery suggestive of pulmonary hypertension.  CT chest 05/26/2017- stable bilateral pulmonary nodules.  Mild emphysema. I have reviewed the images personally.  PFTs  05/05/17 FVC 3.61 [98%], FEV1 2.87 [82%], F/F 79, TLC 83%, DLCO 81% Normal test.  Cardiac Echocardiogram 03/17/17 LVEF 16-60%, grade 2 diastolic dysfunction, high ventricular filling pressure. PASP 23   Assessment:  Emphysema CT scan shows mild to moderate emphysema. PFTs reviewed today did not show significant obstruction He is asymptomatic and does not need to be on any inhalers.  He quit smoking in 2018. Advised him to work on diet, weight loss and exercise.  Follow-up in pulmonary clinic as needed.   Evaluation for pulmonary hypertension CT scan shows enlarged pulmonary artery however echocardiogram shows no evidence of pulmonary  hypertension Sleep study had been ordered but patient canceled due to the cost.  As he denies any daytime sleepiness and there is no pulmonary hypertension we can cancel the test.  Subcentimeter pulmonary nodule Stable on follow-up Continue annual low-dose screening CTs  Health  Maintenance 10/13/2017-influenza 05/05/2017-Pneumovax  Plan/Recommendations: -Continue annual screening CT Follow-up in clinic as needed.  Marshell Garfinkel MD [facility] 11/03/2017, 9:07 AM  CC: Pleas Koch, NP

## 2018-04-04 IMAGING — CT CT CHEST LUNG CANCER SCREENING LOW DOSE W/O CM
1 of 4 series · 10 of 40 positions shown, 13 images · non-contrast
Comparison: Plain films 01/12/2006.  No prior CT.

CLINICAL DATA: Asymptomatic former smoker with 50 pack-year
history.

EXAM:
CT CHEST WITHOUT CONTRAST LOW-DOSE FOR LUNG CANCER SCREENING
TECHNIQUE: Multidetector CT imaging of the chest was performed following the
standard protocol without IV contrast.

[ct lung segmentation data · axial · 0.84mm/px · z∈[-250,-250]mm · 10 of 332 frames shown]
[frame 1/332  mediastinal]
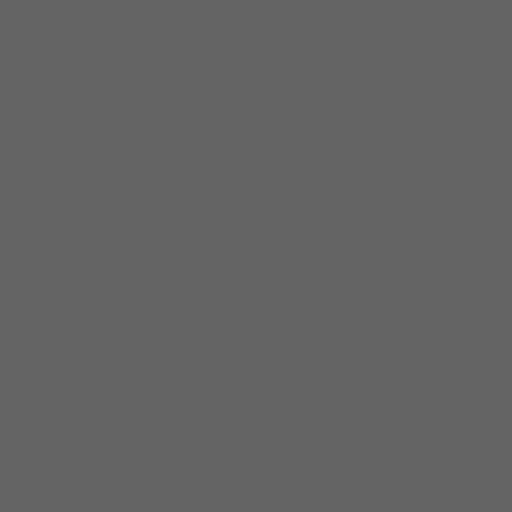
[frame 1/332  lung]
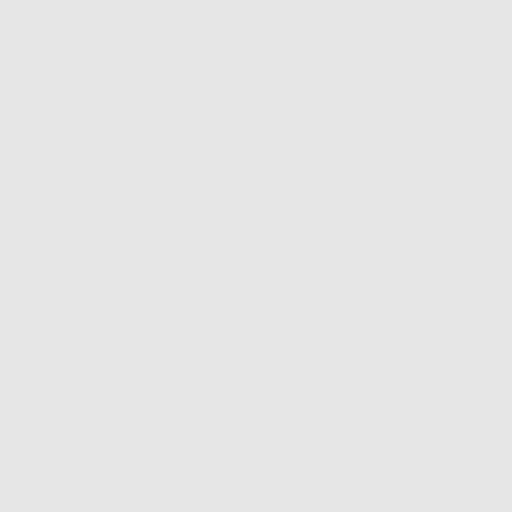
[frame 37/332  lung]
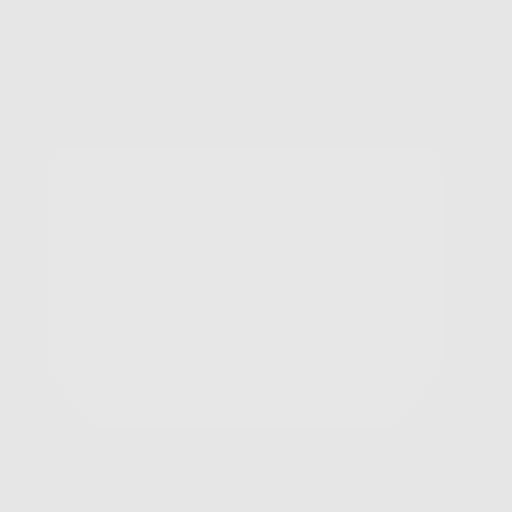
[frame 74/332  lung]
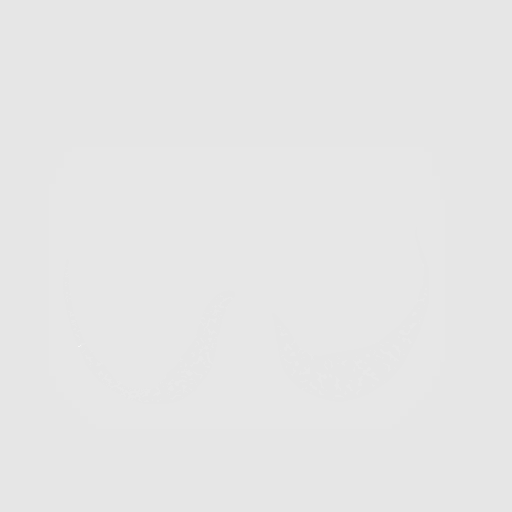
[frame 111/332  lung]
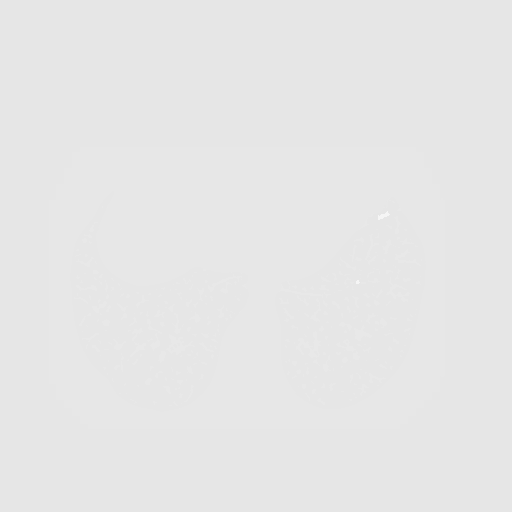
[frame 148/332  mediastinal]
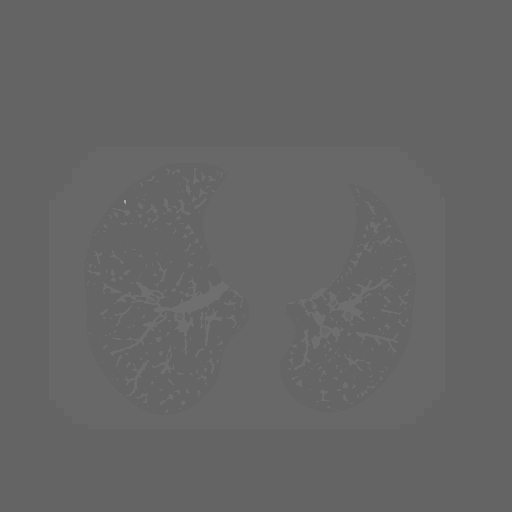
[frame 148/332  lung]
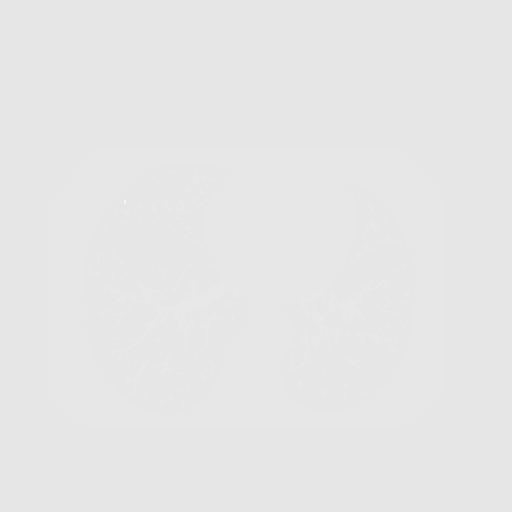
[frame 184/332  lung]
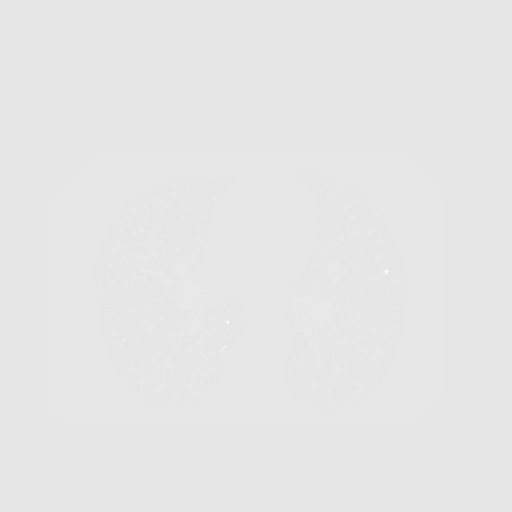
[frame 221/332  lung]
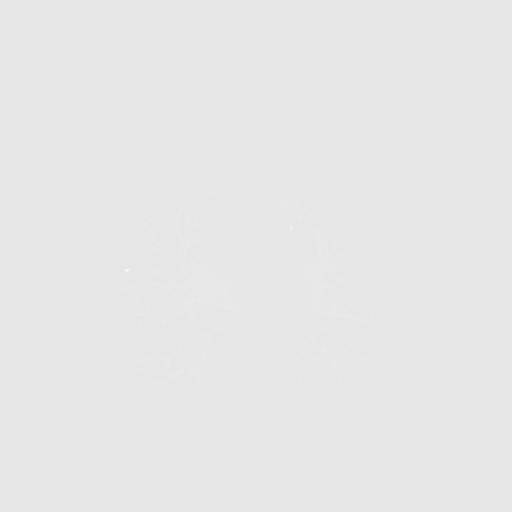
[frame 258/332  lung]
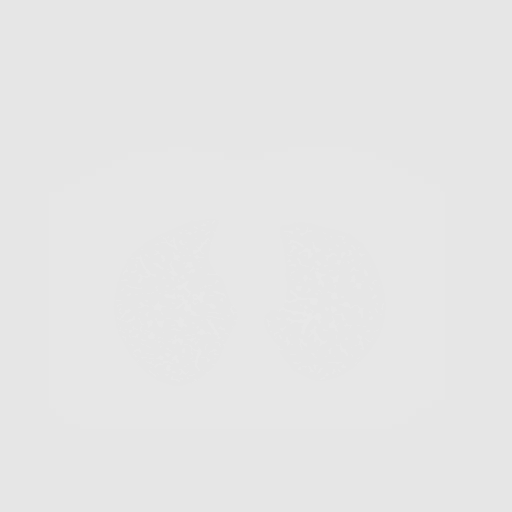
[frame 295/332  mediastinal]
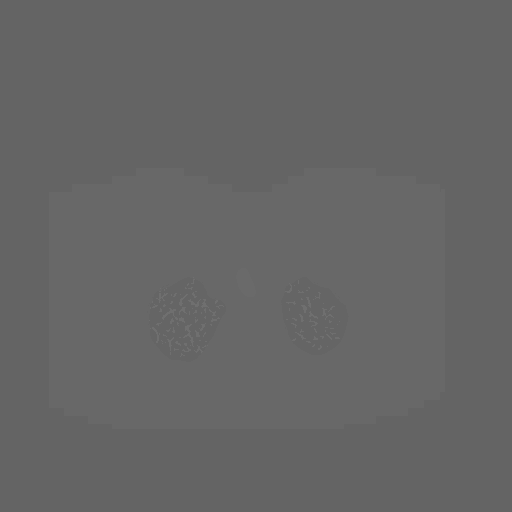
[frame 295/332  lung]
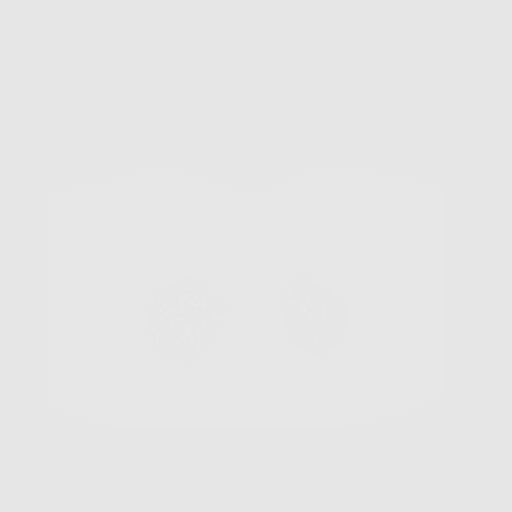
[frame 332/332  lung]
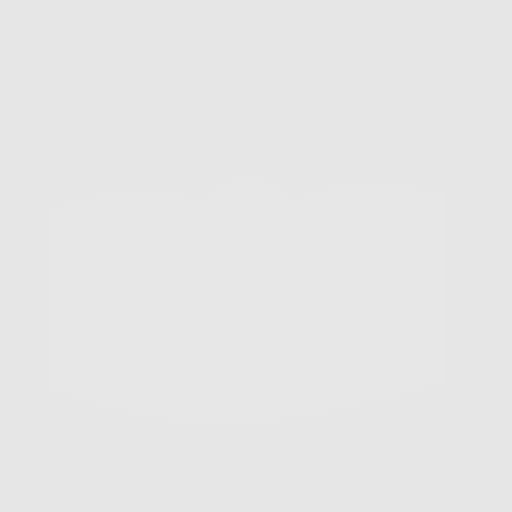

[10 of 40 positions shown; findings below may reference images not displayed]

FINDINGS: Cardiovascular: Aortic and branch vessel atherosclerosis. Tortuous
thoracic aorta. Normal heart size, without pericardial effusion.
Pulmonary artery enlargement, outflow tract 3.2 cm.

Mediastinum/Nodes: No mediastinal or definite hilar adenopathy,
given limitations of unenhanced CT.

Lungs/Pleura: No pleural fluid. Mild to moderate centrilobular and
mild paraseptal emphysema. Multiple bilateral pulmonary nodules. The
largest is in the left lower lobe laterally, pleural-based and
measures volume derived equivalent diameter 6.6 mm on image
213/series 3.

Upper Abdomen: Multiple hepatic cysts. Normal imaged portions of the
spleen, stomach, pancreas, gallbladder, adrenal glands, kidneys.

Musculoskeletal: Remote right rib fractures. Mild to moderate
thoracic spondylosis.
IMPRESSION: 1. Lung-RADS 3, probably benign findings. Short-term follow-up in 6
months is recommended with repeat low-dose chest CT without contrast
(please use the following order, "CT CHEST LCS NODULE FOLLOW-UP W/O
CM"). Multiple bilateral pulmonary nodules, including a left lower
lobe nodule which measures volume derived equivalent diameter
mm.
2. Pulmonary artery enlargement suggests pulmonary arterial
hypertension.
3.  Aortic Atherosclerosis ([id number]).

## 2018-07-17 ENCOUNTER — Other Ambulatory Visit: Payer: Self-pay | Admitting: *Deleted

## 2018-07-17 DIAGNOSIS — Z87891 Personal history of nicotine dependence: Secondary | ICD-10-CM

## 2018-07-17 DIAGNOSIS — Z122 Encounter for screening for malignant neoplasm of respiratory organs: Secondary | ICD-10-CM

## 2018-08-24 ENCOUNTER — Ambulatory Visit: Payer: Commercial Managed Care - PPO

## 2019-03-29 ENCOUNTER — Encounter: Payer: Self-pay | Admitting: *Deleted

## 2019-04-18 ENCOUNTER — Telehealth: Payer: Self-pay | Admitting: Acute Care

## 2019-04-22 NOTE — Telephone Encounter (Signed)
ATC - Voicemail not set up - Will call back

## 2019-04-24 ENCOUNTER — Other Ambulatory Visit: Payer: Self-pay | Admitting: *Deleted

## 2019-04-24 DIAGNOSIS — Z87891 Personal history of nicotine dependence: Secondary | ICD-10-CM

## 2019-04-24 NOTE — Telephone Encounter (Signed)
Spoke with pt regarding lung screening CT through the grant. Pt verified that he is still without insurance coverage at this time. I advised pt that I will send his ticket to him in the mail to take to [facility] imaging when he goes for his free screening CT. Also advised pt that he will receive a call from [place] to schedule the CT. PT verbalized understanding. Nothing further needed.

## 2019-04-24 NOTE — Progress Notes (Signed)
Chest  

## 2019-04-24 NOTE — Telephone Encounter (Signed)
ATC to call pt - Unable to leave message Left message for pt's contact (DPR) Ralph Munoz to have pt call the office. Need to discuss lung screening CT

## 2019-05-09 ENCOUNTER — Telehealth: Payer: Self-pay | Admitting: Acute Care

## 2019-05-14 NOTE — Telephone Encounter (Signed)
Spoke with pt and advised him to call [facility] to get his grant CT scheduled. Order has been placed. Pt verbalized understanding and will call me back if any trouble with the scheduling. Nothing further needed at this time.

## 2019-05-22 ENCOUNTER — Other Ambulatory Visit: Payer: Self-pay

## 2019-05-22 ENCOUNTER — Ambulatory Visit
Admission: RE | Admit: 2019-05-22 | Discharge: 2019-05-22 | Disposition: A | Discharge status: Home / Self Care | Payer: Commercial Managed Care - PPO | Source: Ambulatory Visit | Attending: Acute Care | Admitting: Acute Care

## 2019-05-22 DIAGNOSIS — Z87891 Personal history of nicotine dependence: Secondary | ICD-10-CM

## 2019-05-22 DIAGNOSIS — Z122 Encounter for screening for malignant neoplasm of respiratory organs: Secondary | ICD-10-CM

## 2019-05-23 NOTE — Progress Notes (Signed)
Please call patient and let them  know their  low dose Ct was read as a Lung RADS 2: nodules that are benign in appearance and behavior with a very low likelihood of becoming a clinically active cancer due to size or lack of growth. Recommendation per radiology is for a repeat LDCT in 12 months. .Please let them  know we will order and schedule their  annual screening scan for 04/2020. Please let them  know there was notation of CAD on their  scan.  Please remind the patient  that this is a non-gated exam therefore degree or severity of disease  cannot be determined. Please have them  follow up with their PCP regarding potential risk factor modification, dietary therapy or pharmacologic therapy if clinically indicated. Pt.  is  not currently on statin therapy. Please place order for annual  screening scan for  04/2020 and fax results to PCP. Thanks so much.

## 2019-05-24 ENCOUNTER — Other Ambulatory Visit: Payer: Self-pay | Admitting: *Deleted

## 2019-05-24 DIAGNOSIS — Z87891 Personal history of nicotine dependence: Secondary | ICD-10-CM

## 2020-05-22 ENCOUNTER — Ambulatory Visit
Admission: RE | Admit: 2020-05-22 | Discharge: 2020-05-22 | Disposition: A | Discharge status: Home / Self Care | Payer: Medicare Other | Source: Ambulatory Visit | Attending: Acute Care | Admitting: Acute Care

## 2020-05-22 DIAGNOSIS — Z87891 Personal history of nicotine dependence: Secondary | ICD-10-CM

## 2020-05-31 NOTE — Progress Notes (Signed)
Please call patient and let them  know their  low dose Ct was read as a Lung RADS 2: nodules that are benign in appearance and behavior with a very low likelihood of becoming a clinically active cancer due to size or lack of growth. Recommendation per radiology is for a repeat LDCT in 12 months. Please let them  know we will order and schedule their  annual screening scan for 04/2021. Please let them  know there was notation of CAD on their  scan.  Please remind the patient  that this is a non-gated exam therefore degree or severity of disease  cannot be determined. Please have them  follow up with their PCP regarding potential risk factor modification, dietary therapy or pharmacologic therapy if clinically indicated. Pt.  is not  currently on statin therapy. Please place order for annual  screening scan for  04/2021 and fax results to PCP. Thanks so much. 

## 2020-06-02 ENCOUNTER — Other Ambulatory Visit: Payer: Self-pay | Admitting: *Deleted

## 2020-06-02 DIAGNOSIS — Z87891 Personal history of nicotine dependence: Secondary | ICD-10-CM

## 2021-05-07 ENCOUNTER — Telehealth: Payer: Self-pay | Admitting: Acute Care

## 2021-05-07 NOTE — Telephone Encounter (Signed)
Spoke with pt regarding lung screening CT scan and advised that the scan will now be billed under his Medicare plan. Pt verbalized understanding and had no further questions.  ?

## 2021-05-24 ENCOUNTER — Ambulatory Visit
Admission: RE | Admit: 2021-05-24 | Discharge: 2021-05-24 | Disposition: A | Discharge status: Home / Self Care | Payer: Medicare Other | Source: Ambulatory Visit | Attending: Acute Care | Admitting: Acute Care

## 2021-05-24 DIAGNOSIS — Z87891 Personal history of nicotine dependence: Secondary | ICD-10-CM

## 2021-05-26 ENCOUNTER — Other Ambulatory Visit: Payer: Self-pay | Admitting: Acute Care

## 2021-05-26 DIAGNOSIS — Z87891 Personal history of nicotine dependence: Secondary | ICD-10-CM

## 2021-05-26 DIAGNOSIS — Z122 Encounter for screening for malignant neoplasm of respiratory organs: Secondary | ICD-10-CM

## 2022-05-15 ENCOUNTER — Other Ambulatory Visit: Payer: Self-pay | Admitting: Acute Care

## 2022-05-15 DIAGNOSIS — Z122 Encounter for screening for malignant neoplasm of respiratory organs: Secondary | ICD-10-CM

## 2022-05-15 DIAGNOSIS — Z87891 Personal history of nicotine dependence: Secondary | ICD-10-CM

## 2022-05-27 ENCOUNTER — Ambulatory Visit
Admission: RE | Admit: 2022-05-27 | Discharge: 2022-05-27 | Disposition: A | Discharge status: Home / Self Care | Payer: Medicare HMO | Source: Ambulatory Visit | Attending: Acute Care | Admitting: Acute Care

## 2022-05-27 DIAGNOSIS — Z87891 Personal history of nicotine dependence: Secondary | ICD-10-CM

## 2022-05-27 DIAGNOSIS — Z122 Encounter for screening for malignant neoplasm of respiratory organs: Secondary | ICD-10-CM

## 2022-05-31 ENCOUNTER — Other Ambulatory Visit: Payer: Medicare Other

## 2022-06-01 ENCOUNTER — Other Ambulatory Visit: Payer: Self-pay

## 2022-06-01 DIAGNOSIS — Z87891 Personal history of nicotine dependence: Secondary | ICD-10-CM

## 2023-05-29 ENCOUNTER — Ambulatory Visit
Admission: RE | Admit: 2023-05-29 | Discharge: 2023-05-29 | Disposition: A | Discharge status: Home / Self Care | Source: Ambulatory Visit | Attending: Acute Care | Admitting: Acute Care

## 2023-05-29 DIAGNOSIS — Z87891 Personal history of nicotine dependence: Secondary | ICD-10-CM

## 2023-06-30 ENCOUNTER — Telehealth: Payer: Self-pay

## 2023-06-30 ENCOUNTER — Telehealth: Payer: Self-pay | Admitting: Acute Care

## 2023-06-30 NOTE — Telephone Encounter (Signed)
 Copied from CRM [id number]. Topic: Clinical - Lab/Test Results >> Jun 28, 2023  3:29 PM Margarette Shawl wrote: Reason for CRM:   Pt requesting call back to review results form LCS performed on 05/05. Results were received in clinic on 05/30   CB#  [phone](wife, [place] )  Spoke with patient regarding prior message . Patient was calling for his CT scan result's .  Isa Manuel can you please advise .  Thank you

## 2023-06-30 NOTE — Telephone Encounter (Signed)
 This has been slowly growing since 2021. Lets do a 6 month follow up. 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 Thanks so much

## 2023-07-03 ENCOUNTER — Other Ambulatory Visit: Payer: Self-pay

## 2023-07-03 DIAGNOSIS — Z87891 Personal history of nicotine dependence: Secondary | ICD-10-CM

## 2023-07-03 DIAGNOSIS — Z122 Encounter for screening for malignant neoplasm of respiratory organs: Secondary | ICD-10-CM

## 2023-07-03 DIAGNOSIS — R911 Solitary pulmonary nodule: Secondary | ICD-10-CM

## 2023-07-03 NOTE — Telephone Encounter (Signed)
 I have spoken with the patient and reviewed his recent Lung CT results. He will complete a 6 month f/u scan 11/30/2023 to evaluate a slow growing nodule since 2021. Order placed. Results and plan to PCP.

## 2023-11-30 ENCOUNTER — Ambulatory Visit
Admission: RE | Admit: 2023-11-30 | Discharge: 2023-11-30 | Disposition: A | Discharge status: Home / Self Care | Source: Ambulatory Visit | Attending: Acute Care | Admitting: Acute Care

## 2023-11-30 DIAGNOSIS — Z122 Encounter for screening for malignant neoplasm of respiratory organs: Secondary | ICD-10-CM

## 2023-11-30 DIAGNOSIS — R911 Solitary pulmonary nodule: Secondary | ICD-10-CM

## 2023-11-30 DIAGNOSIS — Z87891 Personal history of nicotine dependence: Secondary | ICD-10-CM

## 2023-12-06 ENCOUNTER — Other Ambulatory Visit: Payer: Self-pay

## 2023-12-06 DIAGNOSIS — Z122 Encounter for screening for malignant neoplasm of respiratory organs: Secondary | ICD-10-CM

## 2023-12-06 DIAGNOSIS — Z87891 Personal history of nicotine dependence: Secondary | ICD-10-CM
